# Patient Record
Sex: Male | Born: 1935 | Race: White | Hispanic: No | Marital: Single | Smoking: Former smoker
Health system: Southern US, Community
[De-identification: ages and names within clinical notes are randomized; demographics above are authoritative.]

## PROBLEM LIST (undated history)

## (undated) DIAGNOSIS — J302 Other seasonal allergic rhinitis: Secondary | ICD-10-CM

## (undated) DIAGNOSIS — N390 Urinary tract infection, site not specified: Secondary | ICD-10-CM

## (undated) DIAGNOSIS — I700 Atherosclerosis of aorta: Secondary | ICD-10-CM

## (undated) DIAGNOSIS — R319 Hematuria, unspecified: Secondary | ICD-10-CM

## (undated) DIAGNOSIS — K5730 Diverticulosis of large intestine without perforation or abscess without bleeding: Secondary | ICD-10-CM

## (undated) DIAGNOSIS — I4819 Other persistent atrial fibrillation: Secondary | ICD-10-CM

## (undated) DIAGNOSIS — I4892 Unspecified atrial flutter: Secondary | ICD-10-CM

## (undated) DIAGNOSIS — K4090 Unilateral inguinal hernia, without obstruction or gangrene, not specified as recurrent: Secondary | ICD-10-CM

## (undated) DIAGNOSIS — I272 Pulmonary hypertension, unspecified: Secondary | ICD-10-CM

## (undated) DIAGNOSIS — C4490 Unspecified malignant neoplasm of skin, unspecified: Secondary | ICD-10-CM

## (undated) DIAGNOSIS — I10 Essential (primary) hypertension: Secondary | ICD-10-CM

## (undated) DIAGNOSIS — Z8669 Personal history of other diseases of the nervous system and sense organs: Secondary | ICD-10-CM

## (undated) HISTORY — DX: Hematuria, unspecified: R31.9

## (undated) HISTORY — DX: Essential (primary) hypertension: I10

## (undated) HISTORY — PX: CATARACT EXTRACTION, BILATERAL: SHX1313

## (undated) HISTORY — DX: Other persistent atrial fibrillation: I48.19

## (undated) HISTORY — PX: TYMPANOPLASTY: SHX33

## (undated) HISTORY — PX: COLONOSCOPY: SHX174

---

## 2002-10-07 ENCOUNTER — Ambulatory Visit (HOSPITAL_COMMUNITY): Admission: RE | Admit: 2002-10-07 | Discharge: 2002-10-07 | Payer: Self-pay | Admitting: Gastroenterology

## 2017-07-03 ENCOUNTER — Other Ambulatory Visit (HOSPITAL_COMMUNITY): Payer: Self-pay | Admitting: *Deleted

## 2017-07-03 DIAGNOSIS — I480 Paroxysmal atrial fibrillation: Secondary | ICD-10-CM

## 2017-07-04 ENCOUNTER — Ambulatory Visit (HOSPITAL_COMMUNITY)
Admission: RE | Admit: 2017-07-04 | Discharge: 2017-07-04 | Disposition: A | Discharge status: Home / Self Care | Payer: Medicare Other | Source: Ambulatory Visit | Attending: Internal Medicine | Admitting: Internal Medicine

## 2017-07-04 ENCOUNTER — Encounter (HOSPITAL_COMMUNITY): Payer: Self-pay | Admitting: Nurse Practitioner

## 2017-07-04 ENCOUNTER — Other Ambulatory Visit: Payer: Self-pay

## 2017-07-04 ENCOUNTER — Ambulatory Visit (HOSPITAL_BASED_OUTPATIENT_CLINIC_OR_DEPARTMENT_OTHER)
Admission: RE | Admit: 2017-07-04 | Discharge: 2017-07-04 | Disposition: A | Discharge status: Home / Self Care | Payer: Medicare Other | Source: Ambulatory Visit | Attending: Nurse Practitioner | Admitting: Nurse Practitioner

## 2017-07-04 VITALS — BP 130/78 | HR 115 | Ht 67.0 in | Wt 196.2 lb

## 2017-07-04 DIAGNOSIS — I4891 Unspecified atrial fibrillation: Secondary | ICD-10-CM | POA: Diagnosis present

## 2017-07-04 DIAGNOSIS — I480 Paroxysmal atrial fibrillation: Secondary | ICD-10-CM | POA: Diagnosis not present

## 2017-07-04 DIAGNOSIS — I272 Pulmonary hypertension, unspecified: Secondary | ICD-10-CM

## 2017-07-04 DIAGNOSIS — Z87891 Personal history of nicotine dependence: Secondary | ICD-10-CM | POA: Insufficient documentation

## 2017-07-04 DIAGNOSIS — Z79899 Other long term (current) drug therapy: Secondary | ICD-10-CM | POA: Insufficient documentation

## 2017-07-04 HISTORY — DX: Pulmonary hypertension, unspecified: I27.20

## 2017-07-04 MED ORDER — METOPROLOL TARTRATE 25 MG PO TABS: 12.5000 mg | ORAL_TABLET | Freq: Two times a day (BID) | ORAL | 1 refills | Status: DC

## 2017-07-04 MED ORDER — APIXABAN 5 MG PO TABS: 5.0000 mg | ORAL_TABLET | Freq: Two times a day (BID) | ORAL | 0 refills | Status: DC

## 2017-07-04 NOTE — Progress Notes (Signed)
Echocardiogram 2D Echocardiogram has been performed.  Manuel Moreno 07/04/2017, 10:39 AM

## 2017-07-04 NOTE — Patient Instructions (Signed)
Start Eliquis 5mg  twice a day  Start Metoprolol 1/2 tablet twice a day

## 2017-07-05 ENCOUNTER — Telehealth (HOSPITAL_COMMUNITY): Payer: Self-pay | Admitting: *Deleted

## 2017-07-05 NOTE — Progress Notes (Signed)
Primary Care Physician: Dr. 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 male that is in the afib clinic for new onset afib that was seen yesterday at time of cataract surgery. He was completely asymptomatic. In the office now for further evaluation. EKG continues to show afib at 115 bpm. He still feels well, denies any shortness of breath with exertion or dyspnea.. Just completed Echo ordered by Dr. Virgina Jock. Results pending.  He denies high caffeine intake. Recommended not to exceed 2 drinks a week. Denies snoring/apnea. He was asked to hold anticoagulation until today as he had cataract surgery yesterday.  Today, he denies symptoms of palpitations, chest pain, shortness of breath, orthopnea, PND, lower extremity edema, dizziness, presyncope, syncope, or neurologic sequela. The patient is tolerating medications without difficulties and is otherwise without complaint today.     Current Outpatient Medications  Medication Sig Dispense Refill  . Cholecalciferol (VITAMIN D) 2000 units CAPS Take 1 capsule by mouth daily.    Marland Kitchen lisinopril (PRINIVIL,ZESTRIL) 20 MG tablet Take 20 mg by mouth daily.    Marland Kitchen loratadine (CLARITIN) 10 MG tablet Take 10 mg by mouth daily.    . Misc Natural Products (PROSTATE HEALTH PO) Take 1 tablet by mouth daily.    . Misc Natural Products (TART CHERRY ADVANCED PO) Take 1 tablet by mouth daily.    . Multiple Vitamin (MULTIVITAMIN) tablet Take 1 tablet by mouth daily.    . Multiple Vitamins-Minerals (PRESERVISION AREDS 2 PO) Take 1 tablet by mouth 2 (two) times daily.    Marland Kitchen apixaban (ELIQUIS) 5 MG TABS tablet Take 1 tablet (5 mg total) by mouth 2 (two) times daily. 60 tablet 0  . metoprolol tartrate (LOPRESSOR) 25 MG tablet Take 0.5 tablets (12.5 mg total) by mouth 2 (two) times daily. 60 tablet 1   No current facility-administered medications for this encounter.     No Known Allergies  Social History   Socioeconomic History  . Marital status:  Single    Spouse name: Not on file  . Number of children: Not on file  . Years of education: Not on file  . Highest education level: Not on file  Occupational History  . Not on file  Social Needs  . Financial resource strain: Not on file  . Food insecurity:    Worry: Not on file    Inability: Not on file  . Transportation needs:    Medical: Not on file    Non-medical: Not on file  Tobacco Use  . Smoking status: Former Research scientist (life sciences)  . Smokeless tobacco: Never Used  Substance and Sexual Activity  . Alcohol use: Not on file  . Drug use: Not on file  . Sexual activity: Not on file  Lifestyle  . Physical activity:    Days per week: Not on file    Minutes per session: Not on file  . Stress: Not on file  Relationships  . Social connections:    Talks on phone: Not on file    Gets together: Not on file    Attends religious service: Not on file    Active member of club or organization: Not on file    Attends meetings of clubs or organizations: Not on file    Relationship status: Not on file  . Intimate partner violence:    Fear of current or ex partner: Not on file    Emotionally abused: Not on file    Physically abused: Not on file  Forced sexual activity: Not on file  Other Topics Concern  . Not on file  Social History Narrative  . Not on file    No family history on file.  ROS- All systems are reviewed and negative except as per the HPI above  Physical Exam: Vitals:   07/04/17 1042  BP: 130/78  Pulse: (!) 115  Weight: 196 lb 3.2 oz (89 kg)  Height: 5\' 7"  (1.702 m)   Wt Readings from Last 3 Encounters:  07/04/17 196 lb 3.2 oz (89 kg)    Labs: No results found for: NA, K, CL, CO2, GLUCOSE, BUN, CREATININE, CALCIUM, PHOS, MG No results found for: INR No results found for: CHOL, HDL, LDLCALC, TRIG   GEN- The patient is well appearing, alert and oriented x 3 today.   Head- normocephalic, atraumatic Eyes-  Sclera clear, conjunctiva pink Ears- hearing  intact Oropharynx- clear Neck- supple, no JVP Lymph- no cervical lymphadenopathy Lungs- Clear to ausculation bilaterally, normal work of breathing Heart- Regular rate and rhythm, no murmurs, rubs or gallops, PMI not laterally displaced GI- soft, NT, ND, + BS Extremities- no clubbing, cyanosis, or edema MS- no significant deformity or atrophy Skin- no rash or lesion Psych- euthymic mood, full affect Neuro- strength and sensation are intact  EKG-Afib at 115 bpm, Qrs int 66 ms, qtc 434 ms Echo- Resulted at time of writing note- Study Conclusions  - Left ventricle: The cavity size was normal. Wall thickness was   normal. Indeterminant diastolic function (atrial fibrillation).   The estimated ejection fraction was 50%. Diffuse hypokinesis. - Mitral valve: Mildly calcified annulus. There was no significant   regurgitation. - Left atrium: The atrium was moderately dilated. - Right ventricle: The cavity size was normal. Systolic function   was normal. - Right atrium: The atrium was mildly dilated. - Tricuspid valve: Peak RV-RA gradient (S): 36 mm Hg. - Pulmonary arteries: PA peak pressure: 39 mm Hg (S). - Inferior vena cava: The vessel was normal in size. The   respirophasic diameter changes were in the normal range (>= 50%),   consistent with normal central venous pressure.  Impressions:  - The patient was in rapid atrial fibrillation. EF 50%, mild   diffuse hypokinesis. Normal RV size and systolic function. No   significant valvular abnormalities. Mild pulmonary hypertension.    Assessment and Plan: 1. New onset asymptomatic afib General eduction re afib Feels well Needs rate control Will start metoprolol tartrate 25 mg , 1/2 tab bid to start and may need up titration He will start Eliquis 5 mg bid (80 in July, Weight 196 lb and creatinine from PCP office on recent draw was 1.0) Denies a bleeding history Bleeding precautions discussed  Will see back in 7-10 days  Butch Penny  C. Lizmarie Witters, [facility] [address] [place], [place] [address] [phone]

## 2017-07-05 NOTE — Telephone Encounter (Signed)
Patient called in stating since starting eliquis last night he has noticed dark brown/reddish urine. States he does not have any burning or frequency no history of kidney stones. Instructed pt to contact PCP today for further workup. Pt verbalized understanding.

## 2017-07-11 ENCOUNTER — Ambulatory Visit (HOSPITAL_COMMUNITY)
Admission: RE | Admit: 2017-07-11 | Discharge: 2017-07-11 | Disposition: A | Discharge status: Home / Self Care | Payer: Medicare Other | Source: Ambulatory Visit | Attending: Nurse Practitioner | Admitting: Nurse Practitioner

## 2017-07-11 VITALS — BP 112/64 | HR 92 | Resp 20 | Ht 67.0 in | Wt 191.8 lb

## 2017-07-11 DIAGNOSIS — I4891 Unspecified atrial fibrillation: Secondary | ICD-10-CM | POA: Diagnosis not present

## 2017-07-11 DIAGNOSIS — I4439 Other atrioventricular block: Secondary | ICD-10-CM | POA: Insufficient documentation

## 2017-07-11 DIAGNOSIS — I272 Pulmonary hypertension, unspecified: Secondary | ICD-10-CM | POA: Diagnosis not present

## 2017-07-11 DIAGNOSIS — Z7901 Long term (current) use of anticoagulants: Secondary | ICD-10-CM | POA: Insufficient documentation

## 2017-07-11 DIAGNOSIS — I480 Paroxysmal atrial fibrillation: Secondary | ICD-10-CM | POA: Diagnosis not present

## 2017-07-11 DIAGNOSIS — Z79899 Other long term (current) drug therapy: Secondary | ICD-10-CM | POA: Insufficient documentation

## 2017-07-11 DIAGNOSIS — Z87891 Personal history of nicotine dependence: Secondary | ICD-10-CM | POA: Insufficient documentation

## 2017-07-11 DIAGNOSIS — I4892 Unspecified atrial flutter: Secondary | ICD-10-CM | POA: Diagnosis not present

## 2017-07-11 NOTE — Progress Notes (Signed)
Primary Care Physician: Dr. 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 male that is in the afib clinic for new onset afib that was seen yesterday at time of cataract surgery. He was completely asymptomatic. In the office now for further evaluation. EKG continues to show afib at 115 bpm. He still feels well, denies any shortness of breath with exertion or dyspnea.. Just completed Echo ordered by Dr. Virgina Jock. Results pending.  He denies high caffeine intake. Recommended not to exceed 2 drinks a week. Denies snoring/apnea. He was asked to hold anticoagulation until today as he had cataract surgery yesterday.   F/u in afib clinic, he remains in rate controlled afib, after adding BB. After one day on anticoagulation, he noted blood in urine. Went back to PCP and was found to have UTI and is on Cipro, finishes tomorrow. The blood resolved after 2 days on antibiotic's. He is pending the second cataract on Tuesday.  Today, he denies symptoms of palpitations, chest pain, shortness of breath, orthopnea, PND, lower extremity edema, dizziness, presyncope, syncope, or neurologic sequela. The patient is tolerating medications without difficulties and is otherwise without complaint today.     Current Outpatient Medications  Medication Sig Dispense Refill  . apixaban (ELIQUIS) 5 MG TABS tablet Take 1 tablet (5 mg total) by mouth 2 (two) times daily. 60 tablet 0  . Cholecalciferol (VITAMIN D) 2000 units CAPS Take 1 capsule by mouth daily.    Marland Kitchen lisinopril (PRINIVIL,ZESTRIL) 20 MG tablet Take 20 mg by mouth daily.    Marland Kitchen loratadine (CLARITIN) 10 MG tablet Take 10 mg by mouth daily.    . metoprolol tartrate (LOPRESSOR) 25 MG tablet Take 0.5 tablets (12.5 mg total) by mouth 2 (two) times daily. 60 tablet 1  . Multiple Vitamin (MULTIVITAMIN) tablet Take 1 tablet by mouth daily.    . Multiple Vitamins-Minerals (PRESERVISION AREDS 2 PO) Take 1 tablet by mouth 2 (two) times daily.    . Misc  Natural Products (PROSTATE HEALTH PO) Take 1 tablet by mouth daily.    . Misc Natural Products (TART CHERRY ADVANCED PO) Take 1 tablet by mouth daily.     No current facility-administered medications for this encounter.     No Known Allergies  Social History   Socioeconomic History  . Marital status: Single    Spouse name: Not on file  . Number of children: Not on file  . Years of education: Not on file  . Highest education level: Not on file  Occupational History  . Not on file  Social Needs  . Financial resource strain: Not on file  . Food insecurity:    Worry: Not on file    Inability: Not on file  . Transportation needs:    Medical: Not on file    Non-medical: Not on file  Tobacco Use  . Smoking status: Former Research scientist (life sciences)  . Smokeless tobacco: Never Used  Substance and Sexual Activity  . Alcohol use: Not on file  . Drug use: Not on file  . Sexual activity: Not on file  Lifestyle  . Physical activity:    Days per week: Not on file    Minutes per session: Not on file  . Stress: Not on file  Relationships  . Social connections:    Talks on phone: Not on file    Gets together: Not on file    Attends religious service: Not on file    Active member of club or  organization: Not on file    Attends meetings of clubs or organizations: Not on file    Relationship status: Not on file  . Intimate partner violence:    Fear of current or ex partner: Not on file    Emotionally abused: Not on file    Physically abused: Not on file    Forced sexual activity: Not on file  Other Topics Concern  . Not on file  Social History Narrative  . Not on file    No family history on file.  ROS- All systems are reviewed and negative except as per the HPI above  Physical Exam: Vitals:   07/11/17 1448  BP: 112/64  Pulse: 92  Resp: 20  Weight: 191 lb 12.8 oz (87 kg)  Height: 5\' 7"  (1.702 m)   Wt Readings from Last 3 Encounters:  07/11/17 191 lb 12.8 oz (87 kg)  07/04/17 196 lb 3.2  oz (89 kg)    Labs: No results found for: NA, K, CL, CO2, GLUCOSE, BUN, CREATININE, CALCIUM, PHOS, MG No results found for: INR No results found for: CHOL, HDL, LDLCALC, TRIG   GEN- The patient is well appearing, alert and oriented x 3 today.   Head- normocephalic, atraumatic Eyes-  Sclera clear, conjunctiva pink Ears- hearing intact Oropharynx- clear Neck- supple, no JVP Lymph- no cervical lymphadenopathy Lungs- Clear to ausculation bilaterally, normal work of breathing Heart- irregular rate and rhythm, no murmurs, rubs or gallops, PMI not laterally displaced GI- soft, NT, ND, + BS Extremities- no clubbing, cyanosis, or edema MS- no significant deformity or atrophy Skin- no rash or lesion Psych- euthymic mood, full affect Neuro- strength and sensation are intact  EKG-Afib at 115 bpm, Qrs int 66 ms, qtc 434 ms Echo- Resulted at time of writing note- Study Conclusions  - Left ventricle: The cavity size was normal. Wall thickness was   normal. Indeterminant diastolic function (atrial fibrillation).   The estimated ejection fraction was 50%. Diffuse hypokinesis. - Mitral valve: Mildly calcified annulus. There was no significant   regurgitation. - Left atrium: The atrium was moderately dilated. - Right ventricle: The cavity size was normal. Systolic function   was normal. - Right atrium: The atrium was mildly dilated. - Tricuspid valve: Peak RV-RA gradient (S): 36 mm Hg. - Pulmonary arteries: PA peak pressure: 39 mm Hg (S). - Inferior vena cava: The vessel was normal in size. The   respirophasic diameter changes were in the normal range (>= 50%),   consistent with normal central venous pressure.  Impressions:  - The patient was in rapid atrial fibrillation. EF 50%, mild   diffuse hypokinesis. Normal RV size and systolic function. No   significant valvular abnormalities. Mild pulmonary hypertension.    Assessment and Plan: 1. New onset asymptomatic afib General  eduction re afib Feels well, slightly light headed since adding BB Better rate controlled, especially needed for upcoming cataract surgery Continue metoprolol tartrate 25 mg , 1/2 tab bid   Continue Eliquis 5 mg bid (80 in July, Weight 196 lb and creatinine from PCP office on recent draw was 1.0) Will plan on cardioversion after being on anticoagulation x 3 weeks  Will see back in 7  days  Butch Penny C. Theda Payer, [facility] [address] [place], [place] [address] [phone]

## 2017-07-16 ENCOUNTER — Ambulatory Visit
Admission: RE | Admit: 2017-07-16 | Discharge: 2017-07-16 | Disposition: A | Discharge status: Home / Self Care | Payer: Medicare Other | Source: Ambulatory Visit | Attending: Internal Medicine | Admitting: Internal Medicine

## 2017-07-16 ENCOUNTER — Other Ambulatory Visit: Payer: Self-pay | Admitting: Internal Medicine

## 2017-07-16 DIAGNOSIS — R319 Hematuria, unspecified: Secondary | ICD-10-CM

## 2017-07-16 DIAGNOSIS — K5730 Diverticulosis of large intestine without perforation or abscess without bleeding: Secondary | ICD-10-CM

## 2017-07-16 DIAGNOSIS — I700 Atherosclerosis of aorta: Secondary | ICD-10-CM

## 2017-07-16 DIAGNOSIS — K4090 Unilateral inguinal hernia, without obstruction or gangrene, not specified as recurrent: Secondary | ICD-10-CM

## 2017-07-16 HISTORY — DX: Diverticulosis of large intestine without perforation or abscess without bleeding: K57.30

## 2017-07-16 HISTORY — DX: Atherosclerosis of aorta: I70.0

## 2017-07-16 HISTORY — DX: Unilateral inguinal hernia, without obstruction or gangrene, not specified as recurrent: K40.90

## 2017-07-19 ENCOUNTER — Encounter (HOSPITAL_COMMUNITY): Payer: Self-pay | Admitting: Nurse Practitioner

## 2017-07-19 ENCOUNTER — Ambulatory Visit (HOSPITAL_COMMUNITY)
Admission: RE | Admit: 2017-07-19 | Discharge: 2017-07-19 | Disposition: A | Discharge status: Home / Self Care | Payer: Medicare Other | Source: Ambulatory Visit | Attending: Nurse Practitioner | Admitting: Nurse Practitioner

## 2017-07-19 VITALS — BP 114/76 | HR 73 | Ht 67.0 in | Wt 191.0 lb

## 2017-07-19 DIAGNOSIS — R9431 Abnormal electrocardiogram [ECG] [EKG]: Secondary | ICD-10-CM | POA: Insufficient documentation

## 2017-07-19 DIAGNOSIS — Z7901 Long term (current) use of anticoagulants: Secondary | ICD-10-CM | POA: Insufficient documentation

## 2017-07-19 DIAGNOSIS — I481 Persistent atrial fibrillation: Secondary | ICD-10-CM | POA: Diagnosis not present

## 2017-07-19 DIAGNOSIS — Z87891 Personal history of nicotine dependence: Secondary | ICD-10-CM | POA: Diagnosis not present

## 2017-07-19 DIAGNOSIS — R319 Hematuria, unspecified: Secondary | ICD-10-CM | POA: Diagnosis not present

## 2017-07-19 DIAGNOSIS — I4819 Other persistent atrial fibrillation: Secondary | ICD-10-CM

## 2017-07-19 DIAGNOSIS — Z79899 Other long term (current) drug therapy: Secondary | ICD-10-CM | POA: Insufficient documentation

## 2017-07-19 DIAGNOSIS — I4892 Unspecified atrial flutter: Secondary | ICD-10-CM | POA: Diagnosis present

## 2017-07-19 NOTE — Progress Notes (Signed)
Primary Care Physician: Dr. 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 male that is in the afib clinic for new onset afib that was seen yesterday at time of cataract surgery. He was completely asymptomatic. In the office now for further evaluation. EKG continues to show afib at 115 bpm. He still feels well, denies any shortness of breath with exertion or dyspnea.. Just completed Echo ordered by Dr. Virgina Jock. Results pending.  He denies high caffeine intake. Recommended not to exceed 2 drinks a week. Denies snoring/apnea. He was asked to hold anticoagulation until today as he had cataract surgery yesterday.   F/u in afib clinic, he remains in rate controlled afib, after adding BB. After one day on anticoagulation, he noted blood in urine. Went back to PCP and was found to have UTI and is on Cipro, finishes tomorrow. The blood resolved after 2 days on antibiotic's. He is pending the second cataract on Tuesday.  F/u afib clinic, 6/6. He had second cataract  surgery and was told he was in and out of rhythm while on the monitor. I have no strips to review. Pt still would not be eligible for DCCV until after 6/12, but  This will not be helpful if he is paroxysmal. EKG shows atrial flutter with variable  block today. He is still having issues with hematuria. After finishing first antibiotic, hematuria stopped but was present again in a few days. He is now on Cipro and bleeding stopped again on drug after a few days. PCP told pt that if bleeding reoccurs after finishing Cipro, he will send to urology. He is asymptomatic with arrhythmia.  Today, he denies symptoms of palpitations, chest pain, shortness of breath, orthopnea, PND, lower extremity edema, dizziness, presyncope, syncope, or neurologic sequela. The patient is tolerating medications without difficulties and is otherwise without complaint today.     Current Outpatient Medications  Medication Sig Dispense Refill  . apixaban  (ELIQUIS) 5 MG TABS tablet Take 1 tablet (5 mg total) by mouth 2 (two) times daily. 60 tablet 0  . Cholecalciferol (VITAMIN D) 2000 units CAPS Take 1 capsule by mouth daily.    . ciprofloxacin (CIPRO) 250 MG tablet Take 250 mg by mouth 2 (two) times daily.  0  . lisinopril (PRINIVIL,ZESTRIL) 20 MG tablet Take 20 mg by mouth daily.    Marland Kitchen loratadine (CLARITIN) 10 MG tablet Take 10 mg by mouth daily.    . metoprolol tartrate (LOPRESSOR) 25 MG tablet Take 0.5 tablets (12.5 mg total) by mouth 2 (two) times daily. 60 tablet 1  . Misc Natural Products (PROSTATE HEALTH PO) Take 1 tablet by mouth daily.    . Misc Natural Products (TART CHERRY ADVANCED PO) Take 1 tablet by mouth daily.    . Multiple Vitamin (MULTIVITAMIN) tablet Take 1 tablet by mouth daily.    . Multiple Vitamins-Minerals (PRESERVISION AREDS 2 PO) Take 1 tablet by mouth 2 (two) times daily.     No current facility-administered medications for this encounter.     No Known Allergies  Social History   Socioeconomic History  . Marital status: Single    Spouse name: Not on file  . Number of children: Not on file  . Years of education: Not on file  . Highest education level: Not on file  Occupational History  . Not on file  Social Needs  . Financial resource strain: Not on file  . Food insecurity:    Worry: Not on file  Inability: Not on file  . Transportation needs:    Medical: Not on file    Non-medical: Not on file  Tobacco Use  . Smoking status: Former Research scientist (life sciences)  . Smokeless tobacco: Never Used  Substance and Sexual Activity  . Alcohol use: Not on file  . Drug use: Not on file  . Sexual activity: Not on file  Lifestyle  . Physical activity:    Days per week: Not on file    Minutes per session: Not on file  . Stress: Not on file  Relationships  . Social connections:    Talks on phone: Not on file    Gets together: Not on file    Attends religious service: Not on file    Active member of club or organization: Not  on file    Attends meetings of clubs or organizations: Not on file    Relationship status: Not on file  . Intimate partner violence:    Fear of current or ex partner: Not on file    Emotionally abused: Not on file    Physically abused: Not on file    Forced sexual activity: Not on file  Other Topics Concern  . Not on file  Social History Narrative  . Not on file    No family history on file.  ROS- All systems are reviewed and negative except as per the HPI above  Physical Exam: Vitals:   07/19/17 1136  BP: 114/76  Pulse: 73  Weight: 191 lb (86.6 kg)  Height: 5\' 7"  (1.702 m)   Wt Readings from Last 3 Encounters:  07/19/17 191 lb (86.6 kg)  07/11/17 191 lb 12.8 oz (87 kg)  07/04/17 196 lb 3.2 oz (89 kg)    Labs: No results found for: NA, K, CL, CO2, GLUCOSE, BUN, CREATININE, CALCIUM, PHOS, MG No results found for: INR No results found for: CHOL, HDL, LDLCALC, TRIG   GEN- The patient is well appearing, alert and oriented x 3 today.   Head- normocephalic, atraumatic Eyes-  Sclera clear, conjunctiva pink Ears- hearing intact Oropharynx- clear Neck- supple, no JVP Lymph- no cervical lymphadenopathy Lungs- Clear to ausculation bilaterally, normal work of breathing Heart- irregular rate and rhythm, no murmurs, rubs or gallops, PMI not laterally displaced GI- soft, NT, ND, + BS Extremities- no clubbing, cyanosis, or edema MS- no significant deformity or atrophy Skin- no rash or lesion Psych- euthymic mood, full affect Neuro- strength and sensation are intact  EKG-A flutter at 73bpm, Qrs int 72 ms, qtc 425 ms Echo- Study Conclusions  - Left ventricle: The cavity size was normal. Wall thickness was   normal. Indeterminant diastolic function (atrial fibrillation).   The estimated ejection fraction was 50%. Diffuse hypokinesis. - Mitral valve: Mildly calcified annulus. There was no significant   regurgitation. - Left atrium: The atrium was moderately dilated. -  Right ventricle: The cavity size was normal. Systolic function   was normal. - Right atrium: The atrium was mildly dilated. - Tricuspid valve: Peak RV-RA gradient (S): 36 mm Hg. - Pulmonary arteries: PA peak pressure: 39 mm Hg (S). - Inferior vena cava: The vessel was normal in size. The   respirophasic diameter changes were in the normal range (>= 50%),   consistent with normal central venous pressure.  Impressions:  - The patient was in rapid atrial fibrillation. EF 50%, mild   diffuse hypokinesis. Normal RV size and systolic function. No   significant valvular abnormalities. Mild pulmonary hypertension.    Assessment and  Plan: 1. New onset asymptomatic afib General eduction re afib Feels well  Better rate controlled with BB Continue metoprolol tartrate 25 mg , 1/2 tab bid   Continue Eliquis 5 mg bid (80 in July, Weight 196 lb and creatinine from PCP office on recent draw was 1.0) eligible for cardioversion after 6/12 but will place an event monitor on pt to see if paroxysmal since it was reported to pt that he was in SR off and on with cataract surgery Will plan on seeing pt after monitor results are in  2. Hematuria Per PCP  On second round of antibhiotics IF hematuria does not clear with this, plan per  pt is to go  to urology  F/u in clinic in 4-6 weeks  Butch Penny C. Donnica Jarnagin, [facility] [address] [place], [place] [address] [phone]

## 2017-07-19 NOTE — Patient Instructions (Signed)
[facility]  [phone] Heart monitor

## 2017-07-27 ENCOUNTER — Other Ambulatory Visit (HOSPITAL_COMMUNITY): Payer: Self-pay | Admitting: *Deleted

## 2017-07-27 ENCOUNTER — Ambulatory Visit (INDEPENDENT_AMBULATORY_CARE_PROVIDER_SITE_OTHER): Payer: Medicare Other

## 2017-07-27 ENCOUNTER — Telehealth (HOSPITAL_COMMUNITY): Payer: Self-pay | Admitting: *Deleted

## 2017-07-27 DIAGNOSIS — I4819 Other persistent atrial fibrillation: Secondary | ICD-10-CM

## 2017-07-27 DIAGNOSIS — I481 Persistent atrial fibrillation: Secondary | ICD-10-CM

## 2017-07-27 MED ORDER — APIXABAN 5 MG PO TABS: 5.0000 mg | ORAL_TABLET | Freq: Two times a day (BID) | ORAL | 6 refills | Status: DC

## 2017-07-27 NOTE — Telephone Encounter (Signed)
Received strips from [employer] ([employer]) from this am 9:24 am.  It is the baseline transmission with findings of atrial flutter, HR 80 Reviewed and signed by Dr. Irish Lack (DOD).  Strips to medical records to be scanned in chart.

## 2017-07-27 NOTE — Telephone Encounter (Signed)
Received call from [employer] - pt monitor was placed - first recording is aflutter controlled rate in the 70s. Pt is asymptomatic at this time. Strips are being faxed to Dr. Jackalyn Lombard office. Monitor was placed to see if patient is persistent of paroxysmal.Will continue to monitor patient. -

## 2017-08-29 ENCOUNTER — Encounter (HOSPITAL_COMMUNITY): Payer: Self-pay | Admitting: Nurse Practitioner

## 2017-08-29 ENCOUNTER — Ambulatory Visit (HOSPITAL_COMMUNITY)
Admission: RE | Admit: 2017-08-29 | Discharge: 2017-08-29 | Disposition: A | Discharge status: Home / Self Care | Payer: Medicare Other | Source: Ambulatory Visit | Attending: Nurse Practitioner | Admitting: Nurse Practitioner

## 2017-08-29 VITALS — BP 134/78 | HR 80 | Ht 67.0 in | Wt 191.6 lb

## 2017-08-29 DIAGNOSIS — Z79899 Other long term (current) drug therapy: Secondary | ICD-10-CM | POA: Insufficient documentation

## 2017-08-29 DIAGNOSIS — I4891 Unspecified atrial fibrillation: Secondary | ICD-10-CM | POA: Insufficient documentation

## 2017-08-29 DIAGNOSIS — I272 Pulmonary hypertension, unspecified: Secondary | ICD-10-CM | POA: Insufficient documentation

## 2017-08-29 DIAGNOSIS — I480 Paroxysmal atrial fibrillation: Secondary | ICD-10-CM

## 2017-08-29 DIAGNOSIS — R319 Hematuria, unspecified: Secondary | ICD-10-CM | POA: Diagnosis not present

## 2017-08-29 DIAGNOSIS — Z7901 Long term (current) use of anticoagulants: Secondary | ICD-10-CM | POA: Diagnosis not present

## 2017-08-29 DIAGNOSIS — Z87891 Personal history of nicotine dependence: Secondary | ICD-10-CM | POA: Diagnosis not present

## 2017-08-29 LAB — CBC
HCT: 38.7 % — ABNORMAL LOW (ref 39.0–52.0)
HEMOGLOBIN: 12.5 g/dL — AB (ref 13.0–17.0)
MCH: 31.6 pg (ref 26.0–34.0)
MCHC: 32.3 g/dL (ref 30.0–36.0)
MCV: 98 fL (ref 78.0–100.0)
PLATELETS: 252 10*3/uL (ref 150–400)
RBC: 3.95 MIL/uL — ABNORMAL LOW (ref 4.22–5.81)
RDW: 12.3 % (ref 11.5–15.5)
WBC: 6.8 10*3/uL (ref 4.0–10.5)

## 2017-08-29 MED ORDER — APIXABAN 5 MG PO TABS: 5.0000 mg | ORAL_TABLET | Freq: Two times a day (BID) | ORAL | 6 refills | Status: DC

## 2017-08-29 NOTE — Patient Instructions (Addendum)
Stop metoprolol   f/u with Dr. Rayann Heman within the month - scheduling will call you to set up.  CBC today.

## 2017-08-29 NOTE — Progress Notes (Addendum)
Primary Care Physician: Dr. 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 male that is in the afib clinic for new onset afib that was seen yesterday at time of cataract surgery. He was completely asymptomatic. In the office now for further evaluation. EKG continues to show afib at 115 bpm. He still feels well, denies any shortness of breath with exertion or dyspnea.Just completed Echo ordered by Dr. Virgina Jock. Results pending.  He denies high caffeine intake. Recommended not to exceed 2 drinks a week. Denies snoring/apnea. He was asked to hold anticoagulation until today as he had cataract surgery yesterday.   F/u in afib clinic, he remains in rate controlled afib, after adding BB. After one day on anticoagulation, he noted blood in urine. Went back to PCP and was found to have UTI and is on Cipro, finishes tomorrow. The blood resolved after 2 days on antibiotic's. He is pending the second cataract on Tuesday.  F/u afib clinic, 6/6. He had second cataract  surgery and was told he was in and out of rhythm while on the monitor. I have no strips to review. Pt still would not be eligible for DCCV until after 6/12, but  This will not be helpful if he is paroxysmal. EKG shows atrial flutter with variable  block today. He is still having issues with hematuria. After finishing first antibiotic, hematuria stopped but was present again in a few days. He is now on Cipro and bleeding stopped again on drug after a few days. PCP told pt that if bleeding reoccurs after finishing Cipro, he will send to urology. He is asymptomatic with arrhythmia.  Fu/ in afib clinic,7/17. Ekg shows controlled atrial flutter. He reports that he feels miserable . He states that he cannot sleep as to having to urinate all night. He thinks this is secondary to one of the meds for afib. He feels lightheaded and fatigued. He is still having hematuria and is pending a cystoscopy on Monday.  30 day monitor raw data is  reviewed, has not been read by MD as of yet. This does show that he is in SR for over 80% of the time, and afib/flutter less than 20% of the time. He has been very well rate controlled with most HR's around 80 bpm.  Today, he denies symptoms of palpitations, chest pain, shortness of breath, orthopnea, PND, lower extremity edema, dizziness, presyncope, syncope, or neurologic sequela.+ for lightheadedness/increased urination, hematuria, fatigue. The patient is tolerating medications without difficulties and is otherwise without complaint today.     Current Outpatient Medications  Medication Sig Dispense Refill  . apixaban (ELIQUIS) 5 MG TABS tablet Take 1 tablet (5 mg total) by mouth 2 (two) times daily. 60 tablet 6  . Cholecalciferol (VITAMIN D) 2000 units CAPS Take 1 capsule by mouth daily.    Marland Kitchen lisinopril (PRINIVIL,ZESTRIL) 20 MG tablet Take 20 mg by mouth daily.    Marland Kitchen loratadine (CLARITIN) 10 MG tablet Take 10 mg by mouth daily.    . Misc Natural Products (PROSTATE HEALTH PO) Take 1 tablet by mouth daily.    . Misc Natural Products (TART CHERRY ADVANCED PO) Take 1 tablet by mouth daily.    . Multiple Vitamin (MULTIVITAMIN) tablet Take 1 tablet by mouth daily.    . Multiple Vitamins-Minerals (PRESERVISION AREDS 2 PO) Take 1 tablet by mouth 2 (two) times daily.     No current facility-administered medications for this encounter.     No Known Allergies  Social History   Socioeconomic History  . Marital status: Single    Spouse name: Not on file  . Number of children: Not on file  . Years of education: Not on file  . Highest education level: Not on file  Occupational History  . Not on file  Social Needs  . Financial resource strain: Not on file  . Food insecurity:    Worry: Not on file    Inability: Not on file  . Transportation needs:    Medical: Not on file    Non-medical: Not on file  Tobacco Use  . Smoking status: Former Research scientist (life sciences)  . Smokeless tobacco: Never Used  Substance  and Sexual Activity  . Alcohol use: Not on file  . Drug use: Not on file  . Sexual activity: Not on file  Lifestyle  . Physical activity:    Days per week: Not on file    Minutes per session: Not on file  . Stress: Not on file  Relationships  . Social connections:    Talks on phone: Not on file    Gets together: Not on file    Attends religious service: Not on file    Active member of club or organization: Not on file    Attends meetings of clubs or organizations: Not on file    Relationship status: Not on file  . Intimate partner violence:    Fear of current or ex partner: Not on file    Emotionally abused: Not on file    Physically abused: Not on file    Forced sexual activity: Not on file  Other Topics Concern  . Not on file  Social History Narrative  . Not on file    No family history on file.  ROS- All systems are reviewed and negative except as per the HPI above  Physical Exam: Vitals:   08/29/17 1138  BP: 134/78  Pulse: 80  Weight: 191 lb 9.6 oz (86.9 kg)  Height: 5\' 7"  (1.702 m)   Wt Readings from Last 3 Encounters:  08/29/17 191 lb 9.6 oz (86.9 kg)  07/19/17 191 lb (86.6 kg)  07/11/17 191 lb 12.8 oz (87 kg)    Labs: No results found for: NA, K, CL, CO2, GLUCOSE, BUN, CREATININE, CALCIUM, PHOS, MG No results found for: INR No results found for: CHOL, HDL, LDLCALC, TRIG   GEN- The patient is well appearing, alert and oriented x 3 today.   Head- normocephalic, atraumatic Eyes-  Sclera clear, conjunctiva pink Ears- hearing intact Oropharynx- clear Neck- supple, no JVP Lymph- no cervical lymphadenopathy Lungs- Clear to ausculation bilaterally, normal work of breathing Heart- irregular rate and rhythm, no murmurs, rubs or gallops, PMI not laterally displaced GI- soft, NT, ND, + BS Extremities- no clubbing, cyanosis, or edema MS- no significant deformity or atrophy Skin- no rash or lesion Psych- euthymic mood, full affect Neuro- strength and  sensation are intact  EKG-A fib/ flutter at 80 bpm, Qrs int 72 ms, qtc 425 ms Echo- Study Conclusions  - Left ventricle: The cavity size was normal. Wall thickness was   normal. Indeterminant diastolic function (atrial fibrillation).   The estimated ejection fraction was 50%. Diffuse hypokinesis. - Mitral valve: Mildly calcified annulus. There was no significant   regurgitation. - Left atrium: The atrium was moderately dilated. - Right ventricle: The cavity size was normal. Systolic function   was normal. - Right atrium: The atrium was mildly dilated. - Tricuspid valve: Peak RV-RA gradient (S): 36 mm  Hg. - Pulmonary arteries: PA peak pressure: 39 mm Hg (S). - Inferior vena cava: The vessel was normal in size. The   respirophasic diameter changes were in the normal range (>= 50%),   consistent with normal central venous pressure.  Impressions:  - The patient was in rapid atrial fibrillation. EF 50%, mild   diffuse hypokinesis. Normal RV size and systolic function. No   significant valvular abnormalities. Mild pulmonary hypertension.  30 day monitor reviewed shows 80+ % of time in SR, less than 20 % of time in afib, rate ocntrolled  Assessment and Plan: 1. New onset asymptomatic afib/flutter Feels poorly,contributes to either eliquis or metoprolol  Monitor reveals mostly SR Will stop metoprolol and see if pt maintains rate control with paroxysmal episodes and if he feels improved Continue Eliquis 5 mg bid (age 83, Weight 196 lb and creatinine from PCP office on recent draw was 1.0) for now CBC today for persistent hematuria to check for presence of anemia, do not have prior CBC and will request from PCP office  Not a candidate for cardioversion as monitor proves that pt is paroxysmal   2. Hematuria Present since start of anticoagulation Pending cystoscopy on Monday    Addendum- 7/23- Review of 30 day monitor after interpretation by Dr. Lovena Le showed that  afib or flutter  was present  the whole time pt wore the monitor, where the raw data read the aflutter as SR. Therefore, pt would benefit from cardioversion and he would like to precede with this. He states that he has been feeling poorly and blaming BB and eliquis but I believe it is the arrhythmia. He did see urology for hematuria and was told his prostate was enlarged and was given meds. He has not missed any of anticoagulation x 3 weeks so will bring back to office and proceed with restart BB and cardioversion.  He will f/u one week  with Dr. Rayann Heman as planned 8/19.  Geroge Baseman Annelisa Ryback, [facility] [address] [place], [place] [address] [phone]

## 2017-09-04 NOTE — Addendum Note (Signed)
Encounter addended by: Sherran Needs, NP on: 09/04/2017 9:52 AM  Actions taken: Sign clinical note

## 2017-09-06 ENCOUNTER — Ambulatory Visit (HOSPITAL_COMMUNITY)
Admission: RE | Admit: 2017-09-06 | Discharge: 2017-09-06 | Disposition: A | Discharge status: Home / Self Care | Payer: Medicare Other | Source: Ambulatory Visit | Attending: Nurse Practitioner | Admitting: Nurse Practitioner

## 2017-09-06 ENCOUNTER — Encounter (HOSPITAL_COMMUNITY): Payer: Self-pay | Admitting: Nurse Practitioner

## 2017-09-06 VITALS — BP 108/66 | HR 90 | Ht 67.0 in | Wt 196.0 lb

## 2017-09-06 DIAGNOSIS — I481 Persistent atrial fibrillation: Secondary | ICD-10-CM | POA: Diagnosis not present

## 2017-09-06 DIAGNOSIS — I4819 Other persistent atrial fibrillation: Secondary | ICD-10-CM

## 2017-09-06 DIAGNOSIS — Z7901 Long term (current) use of anticoagulants: Secondary | ICD-10-CM | POA: Diagnosis not present

## 2017-09-06 DIAGNOSIS — I4892 Unspecified atrial flutter: Secondary | ICD-10-CM | POA: Diagnosis present

## 2017-09-06 DIAGNOSIS — I4891 Unspecified atrial fibrillation: Secondary | ICD-10-CM | POA: Diagnosis present

## 2017-09-06 DIAGNOSIS — R319 Hematuria, unspecified: Secondary | ICD-10-CM | POA: Diagnosis not present

## 2017-09-06 DIAGNOSIS — Z87891 Personal history of nicotine dependence: Secondary | ICD-10-CM | POA: Insufficient documentation

## 2017-09-06 DIAGNOSIS — Z79899 Other long term (current) drug therapy: Secondary | ICD-10-CM | POA: Insufficient documentation

## 2017-09-06 LAB — BASIC METABOLIC PANEL
Anion gap: 9 (ref 5–15)
BUN: 18 mg/dL (ref 8–23)
CHLORIDE: 110 mmol/L (ref 98–111)
CO2: 23 mmol/L (ref 22–32)
CREATININE: 0.99 mg/dL (ref 0.61–1.24)
Calcium: 8.8 mg/dL — ABNORMAL LOW (ref 8.9–10.3)
GFR calc Af Amer: >60 mL/min (ref >60)
GFR calc non Af Amer: >60 mL/min (ref >60)
GLUCOSE: 142 mg/dL — AB (ref 70–99)
POTASSIUM: 4.1 mmol/L (ref 3.5–5.1)
Sodium: 142 mmol/L (ref 135–145)

## 2017-09-06 LAB — CBC
HCT: 34.1 % — ABNORMAL LOW (ref 39.0–52.0)
HEMOGLOBIN: 11.1 g/dL — AB (ref 13.0–17.0)
MCH: 31.7 pg (ref 26.0–34.0)
MCHC: 32.6 g/dL (ref 30.0–36.0)
MCV: 97.4 fL (ref 78.0–100.0)
PLATELETS: 205 10*3/uL (ref 150–400)
RBC: 3.5 MIL/uL — ABNORMAL LOW (ref 4.22–5.81)
RDW: 12.8 % (ref 11.5–15.5)
WBC: 5.3 10*3/uL (ref 4.0–10.5)

## 2017-09-06 NOTE — Patient Instructions (Signed)
Cardioversion scheduled for Tuesday, July 30th  - Arrive at the [facility] and go to admitting at [facility] not eat or drink anything after midnight the night prior to your procedure.  - Take all your morning medication with a sip of water prior to arrival.  - You will not be able to drive home after your procedure.

## 2017-09-06 NOTE — Progress Notes (Signed)
Primary Care Physician: Dr. 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 male that is in the afib clinic for new onset afib that was seen yesterday at time of cataract surgery. He was completely asymptomatic. In the office now for further evaluation. EKG continues to show afib at 115 bpm. He still feels well, denies any shortness of breath with exertion or dyspnea.Just completed Echo ordered by Dr. Virgina Jock. Results pending.  He denies high caffeine intake. Recommended not to exceed 2 drinks a week. Denies snoring/apnea. He was asked to hold anticoagulation until today as he had cataract surgery yesterday.   F/u in afib clinic, he remains in rate controlled afib, after adding BB. After one day on anticoagulation, he noted blood in urine. Went back to PCP and was found to have UTI and is on Cipro, finishes tomorrow. The blood resolved after 2 days on antibiotic's. He is pending the second cataract on Tuesday.  F/u afib clinic, 6/6. He had second cataract  surgery and was told he was in and out of rhythm while on the monitor. I have no strips to review. Pt still would not be eligible for DCCV until after 6/12, but  This will not be helpful if he is paroxysmal. EKG shows atrial flutter with variable  block today. He is still having issues with hematuria. After finishing first antibiotic, hematuria stopped but was present again in a few days. He is now on Cipro and bleeding stopped again on drug after a few days. PCP told pt that if bleeding reoccurs after finishing Cipro, he will send to urology. He is asymptomatic with arrhythmia.  Fu/ in afib clinic,7/17. Ekg shows controlled atrial flutter. He reports that he feels miserable . He states that he cannot sleep as to having to urinate all night. He thinks this is secondary to one of the meds for afib. He feels lightheaded and fatigued. He is still having hematuria and is pending a cystoscopy on Monday.  30 day monitor raw data is  reviewed, has not been read by MD as of yet. This does show that he is in SR for over 80% of the time, and afib/flutter less than 20% of the time. He has been very well rate controlled with most HR's around 80 bpm.  F/u in afib clinic, 7/25,after  monitor was read by cardiology and showed that pt was  persistently in afib/flutter( report was reading aflutter as SR) and he is back to be set up for cardioversion. On last visit, I stopped metoprolol as he felt the new meds(BB, DOAC) were making him feel terrible. He reported that he was not sleeping 2/2 to frequent urination and was tired. He was still having hematuria. 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 The bleeding has stopped. He is agreeable for cardioversion and has not missed any doses of eliquis 5 mg bid.  Today, he denies symptoms of palpitations, chest pain, shortness of breath, orthopnea, PND, lower extremity edema, dizziness, presyncope, syncope, or neurologic sequela.+ for  fatigue. The patient is tolerating medications without difficulties and is otherwise without complaint today.     Current Outpatient Medications  Medication Sig Dispense Refill  . alfuzosin (UROXATRAL) 10 MG 24 hr tablet Take 10 mg by mouth daily with breakfast.    . apixaban (ELIQUIS) 5 MG  TABS tablet Take 1 tablet (5 mg total) by mouth 2 (two) times daily. 60 tablet 6  . Cholecalciferol (VITAMIN D) 2000 units CAPS Take 1 capsule by mouth daily.    . finasteride (PROSCAR) 5 MG tablet Take 5 mg by mouth daily.    Marland Kitchen lisinopril (PRINIVIL,ZESTRIL) 20 MG tablet Take 20 mg by mouth daily.    Marland Kitchen loratadine (CLARITIN) 10 MG tablet Take 10 mg by mouth daily.    . Misc Natural Products (PROSTATE HEALTH PO) Take 1 tablet by mouth daily.    . Misc Natural Products (TART CHERRY ADVANCED PO) Take 1 tablet by mouth daily.    . Multiple Vitamin (MULTIVITAMIN)  tablet Take 1 tablet by mouth daily.    . Multiple Vitamins-Minerals (PRESERVISION AREDS 2 PO) Take 1 tablet by mouth 2 (two) times daily.     No current facility-administered medications for this encounter.     No Known Allergies  Social History   Socioeconomic History  . Marital status: Single    Spouse name: Not on file  . Number of children: Not on file  . Years of education: Not on file  . Highest education level: Not on file  Occupational History  . Not on file  Social Needs  . Financial resource strain: Not on file  . Food insecurity:    Worry: Not on file    Inability: Not on file  . Transportation needs:    Medical: Not on file    Non-medical: Not on file  Tobacco Use  . Smoking status: Former Research scientist (life sciences)  . Smokeless tobacco: Never Used  Substance and Sexual Activity  . Alcohol use: Not on file  . Drug use: Not on file  . Sexual activity: Not on file  Lifestyle  . Physical activity:    Days per week: Not on file    Minutes per session: Not on file  . Stress: Not on file  Relationships  . Social connections:    Talks on phone: Not on file    Gets together: Not on file    Attends religious service: Not on file    Active member of club or organization: Not on file    Attends meetings of clubs or organizations: Not on file    Relationship status: Not on file  . Intimate partner violence:    Fear of current or ex partner: Not on file    Emotionally abused: Not on file    Physically abused: Not on file    Forced sexual activity: Not on file  Other Topics Concern  . Not on file  Social History Narrative  . Not on file    No family history on file.  ROS- All systems are reviewed and negative except as per the HPI above  Physical Exam: Vitals:   09/06/17 0957  BP: 108/66  Pulse: 90  Weight: 196 lb (88.9 kg)  Height: 5\' 7"  (1.702 m)   Wt Readings from Last 3 Encounters:  09/06/17 196 lb (88.9 kg)  08/29/17 191 lb 9.6 oz (86.9 kg)  07/19/17 191 lb  (86.6 kg)    Labs: No results found for: NA, K, CL, CO2, GLUCOSE, BUN, CREATININE, CALCIUM, PHOS, MG No results found for: INR No results found for: CHOL, HDL, LDLCALC, TRIG   GEN- The patient is well appearing, alert and oriented x 3 today.   Head- normocephalic, atraumatic Eyes-  Sclera clear, conjunctiva pink Ears- hearing intact Oropharynx- clear Neck- supple, no JVP Lymph- no cervical  lymphadenopathy Lungs- Clear to ausculation bilaterally, normal work of breathing Heart- irregular rate and rhythm, no murmurs, rubs or gallops, PMI not laterally displaced GI- soft, NT, ND, + BS Extremities- no clubbing, cyanosis, or edema MS- no significant deformity or atrophy Skin- no rash or lesion Psych- euthymic mood, full affect Neuro- strength and sensation are intact  EKG-A flutter at 90 bpm, qrs int 70 ms, qtc 430 ms Echo- Study Conclusions  - Left ventricle: The cavity size was normal. Wall thickness was   normal. Indeterminant diastolic function (atrial fibrillation).   The estimated ejection fraction was 50%. Diffuse hypokinesis. - Mitral valve: Mildly calcified annulus. There was no significant   regurgitation. - Left atrium: The atrium was moderately dilated. - Right ventricle: The cavity size was normal. Systolic function   was normal. - Right atrium: The atrium was mildly dilated. - Tricuspid valve: Peak RV-RA gradient (S): 36 mm Hg. - Pulmonary arteries: PA peak pressure: 39 mm Hg (S). - Inferior vena cava: The vessel was normal in size. The   respirophasic diameter changes were in the normal range (>= 50%),   consistent with normal central venous pressure.  Impressions:  - The patient was in rapid atrial fibrillation. EF 50%, mild   diffuse hypokinesis. Normal RV size and systolic function. No   significant valvular abnormalities. Mild pulmonary hypertension.  30 day monitor -Study Highlights   1. Atrial fib or atrial fib/flutter with a CVR and RVR 2. No  prolonged pauses 3. Noise artifact 4. No ventricular arrhythmias noted      Assessment and Plan: 1. New onset asymptomatic afib/flutter Felt poorly,contributed to either eliquis or metoprolol,but now improved with tx of enlarged protate Off metoprolol at pt's request for fatigue, is rate controlled today Continue Eliquis 5 mg bid (age 22, Weight 196 lb and creatinine from PCP office on recent draw was 1.0), currently hematuria has stopped with treatment for enlarged prostate, states no missed doses since starting  Cardioversion discussed risk vrs benefit, and pt would like to proceed   2. Hematuria Present since start of anticoagulation Now resolved since seeing urology and starting treatment for enlarged prostate  He will f/u  with Dr. Rayann Heman as planned 8/19.  Geroge Baseman Tivon Lemoine, [facility] [address] [place], [place] [address] [phone]

## 2017-09-10 ENCOUNTER — Ambulatory Visit (HOSPITAL_COMMUNITY): Payer: Medicare Other | Admitting: Nurse Practitioner

## 2017-09-11 ENCOUNTER — Encounter (HOSPITAL_COMMUNITY): Payer: Self-pay | Admitting: *Deleted

## 2017-09-11 ENCOUNTER — Ambulatory Visit (HOSPITAL_COMMUNITY): Payer: Medicare Other | Admitting: Anesthesiology

## 2017-09-11 ENCOUNTER — Ambulatory Visit (HOSPITAL_COMMUNITY)
Admission: RE | Admit: 2017-09-11 | Discharge: 2017-09-11 | Disposition: A | Discharge status: Home / Self Care | Payer: Medicare Other | Source: Ambulatory Visit | Attending: Internal Medicine | Admitting: Internal Medicine

## 2017-09-11 ENCOUNTER — Encounter (HOSPITAL_COMMUNITY)
Admission: RE | Disposition: A | Discharge status: Home / Self Care | Payer: Self-pay | Source: Ambulatory Visit | Attending: Internal Medicine

## 2017-09-11 ENCOUNTER — Other Ambulatory Visit: Payer: Self-pay

## 2017-09-11 DIAGNOSIS — Z87891 Personal history of nicotine dependence: Secondary | ICD-10-CM | POA: Insufficient documentation

## 2017-09-11 DIAGNOSIS — I10 Essential (primary) hypertension: Secondary | ICD-10-CM | POA: Diagnosis not present

## 2017-09-11 DIAGNOSIS — I483 Typical atrial flutter: Secondary | ICD-10-CM

## 2017-09-11 DIAGNOSIS — E669 Obesity, unspecified: Secondary | ICD-10-CM | POA: Diagnosis not present

## 2017-09-11 DIAGNOSIS — Z6830 Body mass index (BMI) 30.0-30.9, adult: Secondary | ICD-10-CM | POA: Insufficient documentation

## 2017-09-11 DIAGNOSIS — Z7901 Long term (current) use of anticoagulants: Secondary | ICD-10-CM | POA: Insufficient documentation

## 2017-09-11 DIAGNOSIS — I4892 Unspecified atrial flutter: Secondary | ICD-10-CM | POA: Diagnosis present

## 2017-09-11 DIAGNOSIS — D649 Anemia, unspecified: Secondary | ICD-10-CM | POA: Diagnosis not present

## 2017-09-11 DIAGNOSIS — I4891 Unspecified atrial fibrillation: Secondary | ICD-10-CM | POA: Insufficient documentation

## 2017-09-11 HISTORY — PX: CARDIOVERSION: SHX1299

## 2017-09-11 SURGERY — CARDIOVERSION
Anesthesia: General

## 2017-09-11 MED ORDER — PROPOFOL 10 MG/ML IV BOLUS
INTRAVENOUS | Status: DC | PRN
  Administered 2017-09-11: 50 mg via INTRAVENOUS

## 2017-09-11 MED ORDER — LIDOCAINE HCL (CARDIAC) PF 100 MG/5ML IV SOSY
PREFILLED_SYRINGE | INTRAVENOUS | Status: DC | PRN
  Administered 2017-09-11: 60 mg via INTRAVENOUS

## 2017-09-11 NOTE — Anesthesia Preprocedure Evaluation (Addendum)
Anesthesia Evaluation  Patient identified by MRN, date of birth, ID band Patient awake    Reviewed: Allergy & Precautions, NPO status , Patient's Chart, lab work & pertinent test results  Airway Mallampati: II  TM Distance: >3 FB Neck ROM: Full    Dental no notable dental hx.    Pulmonary neg pulmonary ROS, former smoker,    Pulmonary exam normal breath sounds clear to auscultation       Cardiovascular hypertension, Pt. on medications Normal cardiovascular exam+ dysrhythmias Atrial Fibrillation  Rhythm:Regular Rate:Normal  ECG: A-flutter, rate 90   Neuro/Psych negative neurological ROS  negative psych ROS   GI/Hepatic negative GI ROS, Neg liver ROS,   Endo/Other  negative endocrine ROS  Renal/GU negative Renal ROS     Musculoskeletal negative musculoskeletal ROS (+)   Abdominal (+) + obese,   Peds  Hematology  (+) anemia ,   Anesthesia Other Findings A-FIB  Reproductive/Obstetrics                           Anesthesia Physical Anesthesia Plan  ASA: III  Anesthesia Plan: General   Post-op Pain Management:    Induction: Intravenous  PONV Risk Score and Plan: 2 and Propofol infusion and Treatment may vary due to age or medical condition  Airway Management Planned: Mask  Additional Equipment:   Intra-op Plan:   Post-operative Plan:   Informed Consent: I have reviewed the patients History and Physical, chart, labs and discussed the procedure including the risks, benefits and alternatives for the proposed anesthesia with the patient or authorized representative who has indicated his/her understanding and acceptance.   Dental advisory given  Plan Discussed with: CRNA  Anesthesia Plan Comments:         Anesthesia Quick Evaluation

## 2017-09-11 NOTE — Transfer of Care (Signed)
Immediate Anesthesia Transfer of Care Note  Patient: [place] Moreno  Procedure(s) Performed: CARDIOVERSION (N/A )  Patient Location: Endoscopy Unit and   Anesthesia Type:General  Level of Consciousness: awake, alert  and oriented  Airway & Oxygen Therapy: Patient Spontanous Breathing and Patient connected to nasal cannula oxygen  Post-op Assessment: Report given to RN and Post -op Vital signs reviewed and stable  Post vital signs: Reviewed and stable  Last Vitals:  Vitals Value Taken Time  BP 120/77 09/11/2017 12:35 PM  Temp    Pulse 55 09/11/2017 12:37 PM  Resp 15 09/11/2017 12:37 PM  SpO2 100 % 09/11/2017 12:37 PM    Last Pain:  Vitals:   09/11/17 1137  TempSrc: Oral  PainSc: 0-No pain         Complications: No apparent anesthesia complications

## 2017-09-11 NOTE — Anesthesia Procedure Notes (Signed)
Procedure Name: General with mask airway Date/Time: 09/11/2017 12:17 PM Performed by: Eligha Bridegroom, CRNA Pre-anesthesia Checklist: Patient identified, Emergency Drugs available, Suction available, Patient being monitored and Timeout performed Patient Re-evaluated:Patient Re-evaluated prior to induction Oxygen Delivery Method: Ambu bag Preoxygenation: Pre-oxygenation with 100% oxygen Induction Type: IV induction

## 2017-09-11 NOTE — H&P (Signed)
   INTERVAL PROCEDURE H&P  History and Physical Interval Note:  09/11/2017 10:32 AM  Manuel Moreno has presented today for their planned procedure. The various methods of treatment have been discussed with the patient and family. After consideration of risks, benefits and other options for treatment, the patient has consented to the procedure.  The patients' outpatient history has been reviewed, patient examined, and no change in status from most recent office note within the past 30 days. I have reviewed the patients' chart and labs and will proceed as planned. Questions were answered to the patient's satisfaction.   Manuel Casino, MD, [facility], [place] Director of the Advanced Lipid Disorders &  Cardiovascular Risk Reduction Clinic Diplomate of the American Board of Clinical Lipidology Attending Cardiologist  Direct Dial: [phone]  Fax: [phone]  Website:  [name] 09/11/2017, 10:32 AM

## 2017-09-11 NOTE — CV Procedure (Signed)
   CARDIOVERSION NOTE  Procedure: Electrical Cardioversion Indications:  Atrial Flutter  Procedure Details:  Consent: Risks of procedure as well as the alternatives and risks of each were explained to the (patient/caregiver).  Consent for procedure obtained.  Time Out: Verified patient identification, verified procedure, site/side was marked, verified correct patient position, special equipment/implants available, medications/allergies/relevent history reviewed, required imaging and test results available.  Performed  Patient placed on cardiac monitor, pulse oximetry, supplemental oxygen as necessary.  Sedation given: Propofol per anesthesia Pacer pads placed anterior and posterior chest.  Cardioverted 1 time(s).  Cardioverted at 150J biphasic.  Impression: Findings: Post procedure EKG shows: NSR Complications: None Patient did tolerate procedure well.  Plan: 1. Successful DCCV to NSR with a single 150J biphasic shock. 2. Follow-up with Roderic Palau, NP in the afib clinic.  Time Spent Directly with the Patient:  30 minutes   Pixie Casino, MD, [facility], [place] Director of the Advanced Lipid Disorders &  Cardiovascular Risk Reduction Clinic Diplomate of the American Board of Clinical Lipidology Attending Cardiologist  Direct Dial: [phone]  Fax: [phone]  Website:  [email or web address] 09/11/2017, 12:41 PM

## 2017-09-12 NOTE — Anesthesia Postprocedure Evaluation (Signed)
Anesthesia Post Note  Patient: Manuel Moreno  Procedure(s) Performed: CARDIOVERSION (N/A )     Patient location during evaluation: PACU Anesthesia Type: General Level of consciousness: awake and alert Pain management: pain level controlled Vital Signs Assessment: post-procedure vital signs reviewed and stable Respiratory status: spontaneous breathing, nonlabored ventilation, respiratory function stable and patient connected to nasal cannula oxygen Cardiovascular status: blood pressure returned to baseline and stable Postop Assessment: no apparent nausea or vomiting Anesthetic complications: no    Last Vitals:  Vitals:   09/11/17 1240 09/11/17 1253  BP: (!) 112/47 121/64  Pulse: 61 (!) 57  Resp: 19 15  Temp: 36.6 C   SpO2: 100% 99%    Last Pain:  Vitals:   09/11/17 1253  TempSrc:   PainSc: 0-No pain                 Ryan P Ellender

## 2017-10-01 ENCOUNTER — Ambulatory Visit (INDEPENDENT_AMBULATORY_CARE_PROVIDER_SITE_OTHER): Payer: Medicare Other | Admitting: Internal Medicine

## 2017-10-01 ENCOUNTER — Encounter: Payer: Self-pay | Admitting: Internal Medicine

## 2017-10-01 VITALS — BP 100/64 | HR 81 | Ht 67.0 in | Wt 193.8 lb

## 2017-10-01 DIAGNOSIS — I4819 Other persistent atrial fibrillation: Secondary | ICD-10-CM

## 2017-10-01 DIAGNOSIS — I481 Persistent atrial fibrillation: Secondary | ICD-10-CM | POA: Diagnosis not present

## 2017-10-01 MED ORDER — LISINOPRIL 10 MG PO TABS: 10.0000 mg | ORAL_TABLET | Freq: Every day | ORAL | 3 refills | Status: DC

## 2017-10-01 NOTE — Patient Instructions (Addendum)
Medication Instructions:  Your physician has recommended you make the following change in your medication:  1.  Decrease your lisinopril 20 mg- Take 1/2 tablet daily by mouth until gone.  When your 20 mg tabs are gone- your next prescription will be for lisinopril 10 mg tablets then take one tablet daily.  Labwork: None ordered.  Testing/Procedures: None ordered.  Follow-Up: Your physician wants you to follow-up in: 3 months with Roderic Palau, NP in afib clinic   Any Other Special Instructions Will Be Listed Below (If Applicable).  If you need a refill on your cardiac medications before your next appointment, please call your pharmacy.

## 2017-10-01 NOTE — Progress Notes (Signed)
Electrophysiology Office Note   Date:  10/01/2017   ID:  Manuel Moreno, DOB 1935/09/06, MRN [id number]  PCP:  Shon Baton, MD    Primary Electrophysiologist: Thompson Grayer, MD    CC: afib   History of Present Illness: Manuel Moreno is a 82 y.o. male who presents today for electrophysiology evaluation.   He is referred by Roderic Palau NP in the AF clinic for EP consultation regarding afib.  The patient was recently found to have afib at time of cataract  Surgery.  He was referred to AF clinic.  The patient has been followed in the AF clinic for several months and found to have both afib and atrial flutter. He has been placed on eliquis.  He underwent cardioversion 09/11/17 which was successful.  Unfortunately, he has returned to atrial flutter.  The patient has tried metoprolol but has not previously tried AAD therapy. He is asymptomatic with afib.  Denies feeling any better with recent cardioversion.  Energy is preserved.  He continues to swim laps without limitation. Today, he denies symptoms of palpitations, chest pain, shortness of breath, orthopnea, PND, lower extremity edema, claudication, presyncope, syncope, bleeding, or neurologic sequela. The patient is tolerating medications without difficulties and is otherwise without complaint today.  + occasional postural dizziness.   Past Medical History:  Diagnosis Date  . Hematuria   . Hypertension   . Persistent atrial fibrillation [facility])     Past Surgical History:  Procedure Laterality Date  . CARDIOVERSION N/A 09/11/2017   Procedure: CARDIOVERSION;  Surgeon: Pixie Casino, MD;  Location: [facility] ENDOSCOPY;  Service: Cardiovascular;  Laterality: N/A;  . EYE SURGERY Bilateral 06/2017, 07/2017     Current Outpatient Medications  Medication Sig Dispense Refill  . alfuzosin (UROXATRAL) 10 MG 24 hr tablet Take 10 mg by mouth daily with breakfast.    . apixaban (ELIQUIS) 5 MG TABS tablet Take 1 tablet (5 mg total) by mouth 2 (two)  times daily. 60 tablet 6  . Cholecalciferol (VITAMIN D) 2000 units CAPS Take 2,000 Units by mouth daily.     . finasteride (PROSCAR) 5 MG tablet Take 5 mg by mouth daily.    Marland Kitchen lisinopril (PRINIVIL,ZESTRIL) 20 MG tablet Take 20 mg by mouth daily.    Marland Kitchen loratadine (CLARITIN) 10 MG tablet Take 10 mg by mouth daily.    . Misc Natural Products (PROSTATE HEALTH PO) Take 1 tablet by mouth daily.    . Misc Natural Products (TART CHERRY ADVANCED PO) Take 1 Can by mouth daily.     . Multiple Vitamin (MULTIVITAMIN) tablet Take 1 tablet by mouth daily.    . Multiple Vitamins-Minerals (PRESERVISION AREDS 2 PO) Take 1 capsule by mouth 2 (two) times daily.      No current facility-administered medications for this visit.     Allergies:   Patient has no known allergies.   Social History:  The patient  reports that he has quit smoking. He has never used smokeless tobacco. He reports that he drinks alcohol. He reports that he does not use drugs.   Family History:  + HTN   ROS:  Please see the history of present illness.   All other systems are personally reviewed and negative.    PHYSICAL EXAM: VS:  BP 100/64   Pulse 81   Ht 5\' 7"  (1.702 m)   Wt 193 lb 12.8 oz (87.9 kg)   SpO2 98%   BMI 30.35 kg/m  , BMI Body mass index is  30.35 kg/m. GEN: Well nourished, well developed, in no acute distress  HEENT: normal  Neck: no JVD, carotid bruits, or masses Cardiac: iRRR; no murmurs, rubs, or gallops,no edema  Respiratory:  clear to auscultation bilaterally, normal work of breathing GI: soft, nontender, nondistended, + BS MS: no deformity or atrophy  Skin: warm and dry  Neuro:  Strength and sensation are intact Psych: euthymic mood, full affect  EKG:  EKG is ordered today. The ekg ordered today is personally reviewed and shows atypical atrial flutter vs coarse afib, V rate 81 bpm   Recent Labs: 09/06/2017: BUN 18; Creatinine, Ser 0.99; Hemoglobin 11.1; Platelets 205; Potassium 4.1; Sodium 142    personally reviewed   Lipid Panel  No results found for: CHOL, TRIG, HDL, CHOLHDL, VLDL, LDLCALC, LDLDIRECT personally reviewed   Wt Readings from Last 3 Encounters:  10/01/17 193 lb 12.8 oz (87.9 kg)  09/11/17 195 lb (88.5 kg)  09/06/17 196 lb (88.9 kg)      Other studies personally reviewed: Additional studies/ records that were reviewed today include: AF clinic notes (as above)  Review of the above records today demonstrates: echo 07/04/17 reveals EF 50%, moderate LA enlargement   ASSESSMENT AND PLAN:  1.  Persistent afib/ atypical atrial flutter (though possibly isthmus dependant) The patient has symptomatic, recurrent persistent asymptomatic atrial fibrillation. He has not tried AAD therapy Chads2vasc score is 3.  he is anticoagulated with eliquis . Therapeutic strategies for afib including rate control and rhythm control were discussed in detail with the patient today. At this time, he is clear that he would prefer rate control long term.  I think that this is very reasonable. No changes today Could consider tikosyn vs amiodarone if he becomes symptomatic in the future  2. htn Reports dizziness occasionally bp is low today Reduce lisinopril to 10mg  daily Regular self bp checks at home encouraged  Follow-up:  Return to see Butch Penny in the AF clinic every 3 months I will see as needed going forward  Current medicines are reviewed at length with the patient today.   The patient does not have concerns regarding his medicines.  The following changes were made today:  none    Signed, Thompson Grayer, MD  10/01/2017 3:28 PM     [address] [place] [address] [phone] (office) [phone] (fax)

## 2017-10-16 ENCOUNTER — Telehealth (HOSPITAL_COMMUNITY): Payer: Self-pay | Admitting: *Deleted

## 2017-10-16 NOTE — Telephone Encounter (Signed)
Patient called in very frustrated with having dark urine/blood in urine again. It had gotten better after seeing the urologist with addition of alfuzosin and proscar but the darker urine started coming back few weeks back. Pt convinced the Eliquis is causing all of his urinary problems and wants to stop the medication or reduce the dose. Eliquis is appropriately dosed and ChadVASC score of 3 were discussed with patient but pt is adamant he is overdosing on eliquis. Pt also endorsing he stopped his lisinopril b/c he was feeling much better on 1/2 dose per allred appt and doesn't feel he needs it. Instructed pt he should call his urologist in regards to blood in urine - states he has a call into his urologist already but physician is in the OR today - instructed him to see PCP to ensure no infection and await recommendations of urologist. Pt states he will see what urologist has to say but he is not going to stay on Eliquis if this continues to happen. Pt states he understands the risks but since we are not treating his AF (opted for rate control instead of rhythm control) he didn't feel it was necessary to continue eliquis. Attempted to explain to patient that treating AF or not he still was at risk for stroke but patient would talk over me. Will await urologist recommendations and patient will "go from there."

## 2018-01-01 ENCOUNTER — Encounter (HOSPITAL_COMMUNITY): Payer: Self-pay | Admitting: Nurse Practitioner

## 2018-01-01 ENCOUNTER — Ambulatory Visit (HOSPITAL_COMMUNITY)
Admission: RE | Admit: 2018-01-01 | Discharge: 2018-01-01 | Disposition: A | Discharge status: Home / Self Care | Payer: Medicare Other | Source: Ambulatory Visit | Attending: Nurse Practitioner | Admitting: Nurse Practitioner

## 2018-01-01 VITALS — BP 136/86 | HR 76 | Ht 67.0 in | Wt 196.0 lb

## 2018-01-01 DIAGNOSIS — R319 Hematuria, unspecified: Secondary | ICD-10-CM | POA: Insufficient documentation

## 2018-01-01 DIAGNOSIS — I480 Paroxysmal atrial fibrillation: Secondary | ICD-10-CM

## 2018-01-01 DIAGNOSIS — I4819 Other persistent atrial fibrillation: Secondary | ICD-10-CM | POA: Diagnosis not present

## 2018-01-01 DIAGNOSIS — Z87891 Personal history of nicotine dependence: Secondary | ICD-10-CM | POA: Insufficient documentation

## 2018-01-01 DIAGNOSIS — I4892 Unspecified atrial flutter: Secondary | ICD-10-CM | POA: Insufficient documentation

## 2018-01-01 DIAGNOSIS — Z7901 Long term (current) use of anticoagulants: Secondary | ICD-10-CM | POA: Insufficient documentation

## 2018-01-01 DIAGNOSIS — Z79899 Other long term (current) drug therapy: Secondary | ICD-10-CM | POA: Insufficient documentation

## 2018-01-01 DIAGNOSIS — I484 Atypical atrial flutter: Secondary | ICD-10-CM

## 2018-01-01 HISTORY — DX: Unspecified atrial flutter: I48.92

## 2018-01-01 NOTE — Progress Notes (Signed)
Primary Care Physician: Dr. 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 male that is in the afib clinic for new onset afib that was seen yesterday at time of cataract surgery. He was completely asymptomatic. In the office now for further evaluation. EKG continues to show afib at 115 bpm. He still feels well, denies any shortness of breath with exertion or dyspnea.Just completed Echo ordered by Dr. Virgina Jock. Results pending.  He denies high caffeine intake. Recommended not to exceed 2 drinks a week. Denies snoring/apnea. He was asked to hold anticoagulation until today as he had cataract surgery yesterday.   F/u in afib clinic, he remains in rate controlled afib, after adding BB. After one day on anticoagulation, he noted blood in urine. Went back to PCP and was found to have UTI and is on Cipro, finishes tomorrow. The blood resolved after 2 days on antibiotic's. He is pending the second cataract on Tuesday.  F/u afib clinic, 6/6. He had second cataract  surgery and was told he was in and out of rhythm while on the monitor. I have no strips to review. Pt still would not be eligible for DCCV until after 6/12, but  This will not be helpful if he is paroxysmal. EKG shows atrial flutter with variable  block today. He is still having issues with hematuria. After finishing first antibiotic, hematuria stopped but was present again in a few days. He is now on Cipro and bleeding stopped again on drug after a few days. PCP told pt that if bleeding reoccurs after finishing Cipro, he will send to urology. He is asymptomatic with arrhythmia.  Fu/ in afib clinic,7/17. Ekg shows controlled atrial flutter. He reports that he feels miserable . He states that he cannot sleep as to having to urinate all night. He thinks this is secondary to one of the meds for afib. He feels lightheaded and fatigued. He is still having hematuria and is pending a cystoscopy on Monday.  30 day monitor raw data is  reviewed, has not been read by MD as of yet. This does show that he is in SR for over 80% of the time, and afib/flutter less than 20% of the time. He has been very well rate controlled with most HR's around 80 bpm.  F/u in afib clinic, 7/25,after  monitor was read by cardiology and showed that pt was  persistently in afib/flutter( report was reading aflutter as SR) and he is back to be set up for cardioversion. On last visit, I stopped metoprolol as he felt the new meds(BB, DOAC) were making him feel terrible. He reported that he was not sleeping 2/2 to frequent urination and was tired. He was still having hematuria. 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 The bleeding has stopped. He is agreeable for cardioversion and has not missed any doses of eliquis 5 mg bid.  F/u in afib clinic, 11/19. He remains in asymptomatic afib. He feels well. He states on his own accord, cut his eliquis in half to 2.5 mg bid and the hematuria stopped. He did this 3 months ago. He has not re challenged the full dose.  She saw Dr. Rayann Heman back in August and it was decided that rate control was the way to proceed.  Today, he denies symptoms of palpitations, chest pain,  shortness of breath, orthopnea, PND, lower extremity edema, dizziness, presyncope, syncope, or neurologic sequela.. The patient is tolerating medications without difficulties and is otherwise without complaint today.     Current Outpatient Medications  Medication Sig Dispense Refill  . alfuzosin (UROXATRAL) 10 MG 24 hr tablet Take 10 mg by mouth daily with breakfast.    . apixaban (ELIQUIS) 5 MG TABS tablet Take 1 tablet (5 mg total) by mouth 2 (two) times daily. (Patient taking differently: Take 2.5 mg by mouth 2 (two) times daily. ) 60 tablet 6  . Cholecalciferol (VITAMIN D) 2000 units CAPS Take 2,000 Units by mouth daily.     .  finasteride (PROSCAR) 5 MG tablet Take 5 mg by mouth daily.    Marland Kitchen loratadine (CLARITIN) 10 MG tablet Take 10 mg by mouth daily.    . Misc Natural Products (PROSTATE HEALTH PO) Take 1 tablet by mouth daily.    . Multiple Vitamin (MULTIVITAMIN) tablet Take 1 tablet by mouth daily.    . Multiple Vitamins-Minerals (PRESERVISION AREDS 2 PO) Take 1 capsule by mouth 2 (two) times daily.      No current facility-administered medications for this encounter.     No Known Allergies  Social History   Socioeconomic History  . Marital status: Single    Spouse name: Not on file  . Number of children: Not on file  . Years of education: Not on file  . Highest education level: Not on file  Occupational History  . Not on file  Social Needs  . Financial resource strain: Not on file  . Food insecurity:    Worry: Not on file    Inability: Not on file  . Transportation needs:    Medical: Not on file    Non-medical: Not on file  Tobacco Use  . Smoking status: Former Research scientist (life sciences)  . Smokeless tobacco: Never Used  Substance and Sexual Activity  . Alcohol use: Yes    Comment: very occasional  . Drug use: Never  . Sexual activity: Not Currently  Lifestyle  . Physical activity:    Days per week: Not on file    Minutes per session: Not on file  . Stress: Not on file  Relationships  . Social connections:    Talks on phone: Not on file    Gets together: Not on file    Attends religious service: Not on file    Active member of club or organization: Not on file    Attends meetings of clubs or organizations: Not on file    Relationship status: Not on file  . Intimate partner violence:    Fear of current or ex partner: Not on file    Emotionally abused: Not on file    Physically abused: Not on file    Forced sexual activity: Not on file  Other Topics Concern  . Not on file  Social History Narrative  . Not on file    No family history on file.  ROS- All systems are reviewed and negative except as  per the HPI above  Physical Exam: Vitals:   01/01/18 1343  BP: 136/86  Pulse: 76  Weight: 88.9 kg  Height: 5\' 7"  (1.702 m)   Wt Readings from Last 3 Encounters:  01/01/18 88.9 kg  10/01/17 87.9 kg  09/11/17 88.5 kg    Labs: Lab Results  Component Value Date   NA 142 09/06/2017   K 4.1 09/06/2017   CL 110 09/06/2017   CO2 23 09/06/2017  GLUCOSE 142 (H) 09/06/2017   BUN 18 09/06/2017   CREATININE 0.99 09/06/2017   CALCIUM 8.8 (L) 09/06/2017   No results found for: INR No results found for: CHOL, HDL, LDLCALC, TRIG   GEN- The patient is well appearing, alert and oriented x 3 today.   Head- normocephalic, atraumatic Eyes-  Sclera clear, conjunctiva pink Ears- hearing intact Oropharynx- clear Neck- supple, no JVP Lymph- no cervical lymphadenopathy Lungs- Clear to ausculation bilaterally, normal work of breathing Heart- irregular rate and rhythm, no murmurs, rubs or gallops, PMI not laterally displaced GI- soft, NT, ND, + BS Extremities- no clubbing, cyanosis, or edema MS- no significant deformity or atrophy Skin- no rash or lesion Psych- euthymic mood, full affect Neuro- strength and sensation are intact  EKG-A flutter at 76 bpm (variable AV block) qrs int 70 ms, qtc 453 ms Echo- Study Conclusions  - Left ventricle: The cavity size was normal. Wall thickness was   normal. Indeterminant diastolic function (atrial fibrillation).   The estimated ejection fraction was 50%. Diffuse hypokinesis. - Mitral valve: Mildly calcified annulus. There was no significant   regurgitation. - Left atrium: The atrium was moderately dilated. - Right ventricle: The cavity size was normal. Systolic function   was normal. - Right atrium: The atrium was mildly dilated. - Tricuspid valve: Peak RV-RA gradient (S): 36 mm Hg. - Pulmonary arteries: PA peak pressure: 39 mm Hg (S). - Inferior vena cava: The vessel was normal in size. The   respirophasic diameter changes were in the normal  range (>= 50%),   consistent with normal central venous pressure.  Impressions:  - The patient was in rapid atrial fibrillation. EF 50%, mild   diffuse hypokinesis. Normal RV size and systolic function. No   significant valvular abnormalities. Mild pulmonary hypertension.  30 day monitor -Study Highlights   1. Atrial fib or atrial fib/flutter with a CVR and RVR 2. No prolonged pauses 3. Noise artifact 4. No ventricular arrhythmias noted      Assessment and Plan: 1. Asymptomatic persistent afib/flutter Continues to  be rate controlled without rate control drugs on board   Off metoprolol at pt's request for fatigue  He has been taking 2.5 mg of Eliquis( on his on accord) for previous hematuria. SInce his prostate has been treated for several  months now, I would like him to rechallenge  at full dose of 5 mg bid,(age 89, Weight 196 lb and creatinine of 0.99 ) . He is aware that taking the lower dose will put at higher risk of stroke.   2. Hematuria Intermittent since staring DOAC was found to have enlarged prostrate and started po meds to improve symptoms  Is due to see urologist again soon He plans to restart the 5 mg bid eliquis and discuss with him if bleeding resumes  He will f/u here in 4 months  Butch Penny C. Carroll, [facility] [address] [place], [place] [address] [phone]

## 2018-01-07 ENCOUNTER — Telehealth (HOSPITAL_COMMUNITY): Payer: Self-pay | Admitting: *Deleted

## 2018-01-07 MED ORDER — APIXABAN 5 MG PO TABS: 5.0000 mg | ORAL_TABLET | Freq: Two times a day (BID) | ORAL | 6 refills | Status: DC

## 2018-01-07 NOTE — Telephone Encounter (Signed)
Pt talked to his urologist who recommended he resume his eliquis at 5mg  BID. Pt states he will do this and see how his urine responds.

## 2018-02-04 ENCOUNTER — Telehealth: Payer: Self-pay | Admitting: Internal Medicine

## 2018-02-04 NOTE — Telephone Encounter (Signed)
   Primary Cardiologist: Pixie Casino, MD  Electrophysiology: Dr. Rayann Heman  Chart reviewed as part of pre-operative protocol coverage. Patient was contacted 02/04/2018 in reference to pre-operative risk assessment for pending surgery as outlined below.  Manuel Moreno was last seen on 01/01/18 by Roderic Palau, NP.  Since that day, SHISHIR KRANTZ has done well. He exercises 3-4 times per week at the [facility] and has had no symtpoms. He has asymptomatic chronic afib, no known CAD.   Therefore, based on ACC/AHA guidelines, the patient would be at acceptable risk for the planned procedure without further cardiovascular testing.   According to our pharmacy protocol: Patient with diagnosis of atrial fibrillation on Eliquis for anticoagulation.    Procedure: cytoscopy bladder biopsy Date of procedure: TBD  CHADS2-VASc score of  3 (, HTN, AGE,, AGE, ) CrCl 73.3 Platelet count 179  Per office protocol, patient can hold Eliquis for 3 days prior to procedure.    I will route this recommendation to the requesting party via Epic fax function and remove from pre-op pool.  Please call with questions.  Daune Perch, NP 02/04/2018, 4:34 PM

## 2018-02-04 NOTE — Telephone Encounter (Signed)
New Message.            [facility] Pre-operative Risk Assessment    Request for surgical clearance:  1. What type of surgery is being performed? Cystoscopy bladder biopsy   2. When is this surgery scheduled? Pending clearance  3. What type of clearance is required (medical clearance vs. Pharmacy clearance to hold med vs. Both)? Both  4. Are there any medications that need to be held prior to surgery and how long? Eliquis 2 to 3 days prior to surgery  5. Practice name and name of physician performing surgery? [facility]/ Dr. Jerilynn Mages [place]  6. What is your office phone number [phone]   7.   What is your office fax number [phone]  8.   Anesthesia type (None, local, MAC, general) ? General   Manuel Moreno 02/04/2018, 3:10 PM  _________________________________________________________________   (provider comments below)

## 2018-02-04 NOTE — Telephone Encounter (Signed)
Patient with diagnosis of atrial fibrillation on Eliquis for anticoagulation.    Procedure: cytoscopy bladder biopsy Date of procedure: TBD  CHADS2-VASc score of  3 (, HTN, AGE,, AGE, )  CrCl 73.3 Platelet count 179  Per office protocol, patient can hold Eliquis for 3 days prior to procedure.

## 2018-02-18 ENCOUNTER — Other Ambulatory Visit: Payer: Self-pay | Admitting: Urology

## 2018-03-07 ENCOUNTER — Encounter (HOSPITAL_BASED_OUTPATIENT_CLINIC_OR_DEPARTMENT_OTHER): Payer: Self-pay

## 2018-03-07 ENCOUNTER — Other Ambulatory Visit: Payer: Self-pay

## 2018-03-07 NOTE — Progress Notes (Signed)
Spoke with: Manuel Moreno NPO:  After Midnight, no gum, candy, or mints   Arrival time: 1749SW Labs: istat 4 (EKG11/19/2019, cardiac clearance12/23/2019 chart and epic) AM medications: Alfuzosin, Finasteride Pre op orders: Yes Ride home:  Leotis Pain (friend) [phone]

## 2018-03-15 ENCOUNTER — Ambulatory Visit (HOSPITAL_BASED_OUTPATIENT_CLINIC_OR_DEPARTMENT_OTHER): Payer: Medicare Other | Admitting: Anesthesiology

## 2018-03-15 ENCOUNTER — Emergency Department (HOSPITAL_BASED_OUTPATIENT_CLINIC_OR_DEPARTMENT_OTHER)
Admission: EM | Admit: 2018-03-15 | Discharge: 2018-03-15 | Disposition: A | Discharge status: Home / Self Care | Payer: Medicare Other | Source: Home / Self Care | Attending: Emergency Medicine | Admitting: Emergency Medicine

## 2018-03-15 ENCOUNTER — Ambulatory Visit (HOSPITAL_BASED_OUTPATIENT_CLINIC_OR_DEPARTMENT_OTHER)
Admission: RE | Admit: 2018-03-15 | Discharge: 2018-03-15 | Disposition: A | Discharge status: Home / Self Care | Payer: Medicare Other | Attending: Urology | Admitting: Urology

## 2018-03-15 ENCOUNTER — Encounter (HOSPITAL_BASED_OUTPATIENT_CLINIC_OR_DEPARTMENT_OTHER): Payer: Self-pay

## 2018-03-15 ENCOUNTER — Encounter (HOSPITAL_BASED_OUTPATIENT_CLINIC_OR_DEPARTMENT_OTHER)
Admission: RE | Disposition: A | Discharge status: Home / Self Care | Payer: Self-pay | Source: Home / Self Care | Attending: Urology

## 2018-03-15 ENCOUNTER — Other Ambulatory Visit: Payer: Self-pay

## 2018-03-15 DIAGNOSIS — I781 Nevus, non-neoplastic: Secondary | ICD-10-CM | POA: Insufficient documentation

## 2018-03-15 DIAGNOSIS — I700 Atherosclerosis of aorta: Secondary | ICD-10-CM | POA: Insufficient documentation

## 2018-03-15 DIAGNOSIS — I10 Essential (primary) hypertension: Secondary | ICD-10-CM | POA: Insufficient documentation

## 2018-03-15 DIAGNOSIS — Z85828 Personal history of other malignant neoplasm of skin: Secondary | ICD-10-CM

## 2018-03-15 DIAGNOSIS — K5730 Diverticulosis of large intestine without perforation or abscess without bleeding: Secondary | ICD-10-CM | POA: Diagnosis not present

## 2018-03-15 DIAGNOSIS — R310 Gross hematuria: Secondary | ICD-10-CM | POA: Diagnosis not present

## 2018-03-15 DIAGNOSIS — N323 Diverticulum of bladder: Secondary | ICD-10-CM | POA: Diagnosis not present

## 2018-03-15 DIAGNOSIS — I272 Pulmonary hypertension, unspecified: Secondary | ICD-10-CM | POA: Diagnosis not present

## 2018-03-15 DIAGNOSIS — Z7689 Persons encountering health services in other specified circumstances: Secondary | ICD-10-CM

## 2018-03-15 DIAGNOSIS — Z466 Encounter for fitting and adjustment of urinary device: Secondary | ICD-10-CM

## 2018-03-15 DIAGNOSIS — Z6830 Body mass index (BMI) 30.0-30.9, adult: Secondary | ICD-10-CM | POA: Diagnosis not present

## 2018-03-15 DIAGNOSIS — Z87891 Personal history of nicotine dependence: Secondary | ICD-10-CM

## 2018-03-15 DIAGNOSIS — N400 Enlarged prostate without lower urinary tract symptoms: Secondary | ICD-10-CM | POA: Diagnosis not present

## 2018-03-15 DIAGNOSIS — Z79899 Other long term (current) drug therapy: Secondary | ICD-10-CM | POA: Insufficient documentation

## 2018-03-15 DIAGNOSIS — E669 Obesity, unspecified: Secondary | ICD-10-CM | POA: Diagnosis not present

## 2018-03-15 DIAGNOSIS — Z7901 Long term (current) use of anticoagulants: Secondary | ICD-10-CM

## 2018-03-15 DIAGNOSIS — Z9842 Cataract extraction status, left eye: Secondary | ICD-10-CM | POA: Diagnosis not present

## 2018-03-15 DIAGNOSIS — Z9841 Cataract extraction status, right eye: Secondary | ICD-10-CM | POA: Diagnosis not present

## 2018-03-15 DIAGNOSIS — I4892 Unspecified atrial flutter: Secondary | ICD-10-CM | POA: Diagnosis not present

## 2018-03-15 HISTORY — DX: Unspecified malignant neoplasm of skin, unspecified: C44.90

## 2018-03-15 HISTORY — PX: CYSTOSCOPY WITH BIOPSY: SHX5122

## 2018-03-15 HISTORY — DX: Urinary tract infection, site not specified: N39.0

## 2018-03-15 HISTORY — DX: Pulmonary hypertension, unspecified: I27.20

## 2018-03-15 HISTORY — DX: Unilateral inguinal hernia, without obstruction or gangrene, not specified as recurrent: K40.90

## 2018-03-15 HISTORY — DX: Personal history of other diseases of the nervous system and sense organs: Z86.69

## 2018-03-15 HISTORY — DX: Unspecified atrial flutter: I48.92

## 2018-03-15 HISTORY — DX: Atherosclerosis of aorta: I70.0

## 2018-03-15 HISTORY — DX: Other seasonal allergic rhinitis: J30.2

## 2018-03-15 HISTORY — DX: Diverticulosis of large intestine without perforation or abscess without bleeding: K57.30

## 2018-03-15 LAB — POCT I-STAT 4, (NA,K, GLUC, HGB,HCT)
Glucose, Bld: 120 mg/dL — ABNORMAL HIGH (ref 70–99)
HCT: 38 % — ABNORMAL LOW (ref 39.0–52.0)
Hemoglobin: 12.9 g/dL — ABNORMAL LOW (ref 13.0–17.0)
Potassium: 4.1 mmol/L (ref 3.5–5.1)
SODIUM: 143 mmol/L (ref 135–145)

## 2018-03-15 SURGERY — CYSTOSCOPY, WITH BIOPSY
Anesthesia: General | Site: Bladder

## 2018-03-15 MED ORDER — OXYCODONE HCL 5 MG PO TABS
5.0000 mg | ORAL_TABLET | Freq: Once | ORAL | Status: DC | PRN
  Filled 2018-03-15: qty 1

## 2018-03-15 MED ORDER — LIDOCAINE 2% (20 MG/ML) 5 ML SYRINGE
INTRAMUSCULAR | Status: AC
  Filled 2018-03-15: qty 5

## 2018-03-15 MED ORDER — ONDANSETRON HCL 4 MG/2ML IJ SOLN
INTRAMUSCULAR | Status: AC
  Filled 2018-03-15: qty 2

## 2018-03-15 MED ORDER — STERILE WATER FOR IRRIGATION IR SOLN
Status: DC | PRN
  Administered 2018-03-15 (×2): 3000 mL

## 2018-03-15 MED ORDER — PROPOFOL 10 MG/ML IV BOLUS
INTRAVENOUS | Status: DC | PRN
  Administered 2018-03-15: 120 mg via INTRAVENOUS

## 2018-03-15 MED ORDER — FENTANYL CITRATE (PF) 100 MCG/2ML IJ SOLN
25.0000 ug | INTRAMUSCULAR | Status: DC | PRN
  Filled 2018-03-15: qty 1

## 2018-03-15 MED ORDER — LACTATED RINGERS IV SOLN
INTRAVENOUS | Status: DC
  Administered 2018-03-15 (×2): via INTRAVENOUS
  Filled 2018-03-15: qty 1000

## 2018-03-15 MED ORDER — APIXABAN 5 MG PO TABS: 5.0000 mg | ORAL_TABLET | Freq: Two times a day (BID) | ORAL | 6 refills | Status: DC

## 2018-03-15 MED ORDER — LIDOCAINE HCL (CARDIAC) PF 100 MG/5ML IV SOSY
PREFILLED_SYRINGE | INTRAVENOUS | Status: DC | PRN
  Administered 2018-03-15: 60 mg via INTRAVENOUS

## 2018-03-15 MED ORDER — CEFAZOLIN SODIUM-DEXTROSE 2-4 GM/100ML-% IV SOLN
2.0000 g | Freq: Once | INTRAVENOUS | Status: AC
  Administered 2018-03-15: 2 g via INTRAVENOUS
  Filled 2018-03-15: qty 100

## 2018-03-15 MED ORDER — PROPOFOL 10 MG/ML IV BOLUS
INTRAVENOUS | Status: AC
  Filled 2018-03-15: qty 20

## 2018-03-15 MED ORDER — FENTANYL CITRATE (PF) 100 MCG/2ML IJ SOLN
INTRAMUSCULAR | Status: DC | PRN
  Administered 2018-03-15: 100 ug via INTRAVENOUS

## 2018-03-15 MED ORDER — CEFAZOLIN SODIUM-DEXTROSE 2-4 GM/100ML-% IV SOLN
INTRAVENOUS | Status: AC
  Filled 2018-03-15: qty 100

## 2018-03-15 MED ORDER — OXYCODONE HCL 5 MG/5ML PO SOLN
5.0000 mg | Freq: Once | ORAL | Status: DC | PRN
  Filled 2018-03-15: qty 5

## 2018-03-15 MED ORDER — ONDANSETRON HCL 4 MG/2ML IJ SOLN
4.0000 mg | Freq: Once | INTRAMUSCULAR | Status: DC | PRN
  Filled 2018-03-15: qty 2

## 2018-03-15 MED ORDER — FENTANYL CITRATE (PF) 100 MCG/2ML IJ SOLN
INTRAMUSCULAR | Status: AC
  Filled 2018-03-15: qty 2

## 2018-03-15 MED ORDER — CEPHALEXIN 500 MG PO CAPS: 500.0000 mg | ORAL_CAPSULE | Freq: Every day | ORAL | 0 refills | Status: DC

## 2018-03-15 MED ORDER — ONDANSETRON HCL 4 MG/2ML IJ SOLN
INTRAMUSCULAR | Status: DC | PRN
  Administered 2018-03-15: 4 mg via INTRAVENOUS

## 2018-03-15 SURGICAL SUPPLY — 23 items
BAG DRAIN URO-CYSTO SKYTR STRL (DRAIN) ×3 IMPLANT
BAG URINE DRAINAGE (UROLOGICAL SUPPLIES) ×3 IMPLANT
BAG URINE LEG 19OZ MD ST LTX (BAG) IMPLANT
BAG URINE LEG 500ML (DRAIN) IMPLANT
CATH FOLEY 2WAY SLVR  5CC 16FR (CATHETERS)
CATH FOLEY 2WAY SLVR  5CC 18FR (CATHETERS) ×2
CATH FOLEY 2WAY SLVR 5CC 16FR (CATHETERS) IMPLANT
CATH FOLEY 2WAY SLVR 5CC 18FR (CATHETERS) ×1 IMPLANT
CLOTH BEACON ORANGE TIMEOUT ST (SAFETY) ×3 IMPLANT
ELECT REM PT RETURN 9FT ADLT (ELECTROSURGICAL) ×3
ELECTRODE REM PT RTRN 9FT ADLT (ELECTROSURGICAL) ×1 IMPLANT
GLOVE BIO SURGEON STRL SZ7.5 (GLOVE) ×3 IMPLANT
GOWN STRL REUS W/ TWL XL LVL3 (GOWN DISPOSABLE) ×1 IMPLANT
GOWN STRL REUS W/TWL XL LVL3 (GOWN DISPOSABLE) ×2
HOLDER FOLEY CATH W/STRAP (MISCELLANEOUS) ×3 IMPLANT
KIT TURNOVER CYSTO (KITS) ×3 IMPLANT
MANIFOLD NEPTUNE II (INSTRUMENTS) ×3 IMPLANT
NEEDLE HYPO 22GX1.5 SAFETY (NEEDLE) IMPLANT
NS IRRIG 500ML POUR BTL (IV SOLUTION) IMPLANT
PACK CYSTO (CUSTOM PROCEDURE TRAY) ×3 IMPLANT
TUBE CONNECTING 12'X1/4 (SUCTIONS)
TUBE CONNECTING 12X1/4 (SUCTIONS) IMPLANT
WATER STERILE IRR 3000ML UROMA (IV SOLUTION) ×6 IMPLANT

## 2018-03-15 NOTE — ED Triage Notes (Signed)
Pt states he had bladder polyp removed at [facility] surgical center this am-states foley is not draining-NAD-steady gait

## 2018-03-15 NOTE — ED Notes (Signed)
Pt verbalized understanding of dc instructions.

## 2018-03-15 NOTE — H&P (Signed)
H&P  Chief Complaint: Gross hematuria, BPH  History of Present Illness: [name] is an 83 year old white male with a history of gross hematuria and BPH.  He underwent office cystoscopy December 2019 which revealed BPH and 2 small bladder diverticulum.  And 1 or both of the diverticulum there was some erythematous mucosa and bleeding.  He underwent CT scan of the abdomen and pelvis July 16, 2017 which was repeated January 15, 2018 and revealed the 2 right anterior diverticulum with some enhancement of the mucosa.  He was brought today for cystoscopy, exam under anesthesia, bladder biopsy and possible TURP or TURBT.  He has been well without cough, colds fever or congestion.  No dysuria.  He did note some red urine last week but otherwise the urine has been clear.  Past Medical History:  Diagnosis Date  . Aortic atherosclerosis ([place]) 07/16/2017   Noted on CT Abd/pelvis  . Atrial flutter by electrocardiogram ([place]) 01/01/2018  . Hematuria   . Hematuria   . History of cataract    Bilateral  . Hypertension    Resolved, no longer taking Lisinopril  . Left inguinal hernia 07/16/2017   Noted on CT Abd/pelvis  . Persistent atrial fibrillation    Asymptomatic  . Pulmonary hypertension ([place]) 07/04/2017   Mild, noted on ECHO  . Seasonal allergies   . Sigmoid diverticulosis 07/16/2017   Noted on CT Abd/pelvis  . Skin cancer    Forehead and edge of ears  . UTI (urinary tract infection)    Past Surgical History:  Procedure Laterality Date  . CARDIOVERSION N/A 09/11/2017   Procedure: CARDIOVERSION;  Surgeon: Pixie Casino, MD;  Location: [facility] ENDOSCOPY;  Service: Cardiovascular;  Laterality: N/A;  . CATARACT EXTRACTION, BILATERAL  06/2017, 07/2017  . COLONOSCOPY    . TYMPANOPLASTY Left 1970's    Home Medications:  Medications Prior to Admission  Medication Sig Dispense Refill Last Dose  . alfuzosin (UROXATRAL) 10 MG 24 hr tablet Take 10 mg by mouth daily with breakfast.   03/15/2018 at 0600   . Cholecalciferol (VITAMIN D) 2000 units CAPS Take 2,000 Units by mouth daily.    Past Week at Unknown time  . doxycycline (ADOXA) 50 MG tablet Take 10 mg by mouth as needed. For rash on scalp   Past Month at Unknown time  . finasteride (PROSCAR) 5 MG tablet Take 5 mg by mouth every morning.    03/15/2018 at 0600  . loratadine (CLARITIN) 10 MG tablet Take 10 mg by mouth daily.   Past Month at Unknown time  . Misc Natural Products (PROSTATE HEALTH PO) Take 1 tablet by mouth daily.   Past Month at Unknown time  . Multiple Vitamin (MULTIVITAMIN) tablet Take 1 tablet by mouth daily.   Past Month at Unknown time  . Multiple Vitamins-Minerals (PRESERVISION AREDS 2 PO) Take 1 capsule by mouth 2 (two) times daily.    Past Month at Unknown time  . apixaban (ELIQUIS) 5 MG TABS tablet Take 1 tablet (5 mg total) by mouth 2 (two) times daily. 60 tablet 6 03/10/2018   Allergies: No Known Allergies  History reviewed. No pertinent family history. Social History:  reports that he quit smoking about 52 years ago. He has never used smokeless tobacco. He reports current alcohol use. He reports that he does not use drugs.  ROS: A complete review of systems was performed.  All systems are negative except for pertinent findings as noted. Review of Systems  All other systems reviewed and  are negative.    Physical Exam:  Vital signs in last 24 hours: Temp:  [97.6 F (36.4 C)] 97.6 F (36.4 C) (01/31 0736) Pulse Rate:  [88] 88 (01/31 0736) Resp:  [16] 16 (01/31 0736) BP: (135)/(89) 135/89 (01/31 0736) SpO2:  [100 %] 100 % (01/31 0736) Weight:  [88.1 kg] 88.1 kg (01/31 0736) General:  Alert and oriented, No acute distress HEENT: Normocephalic, atraumatic Cardiovascular: Regular rate and rhythm Lungs: Regular rate and effort Abdomen: Soft, nontender, nondistended, no abdominal masses Back: No CVA tenderness Extremities: No edema Neurologic: Grossly intact  Laboratory Data:  Results for orders placed or  performed during the hospital encounter of 03/15/18 (from the past 24 hour(s))  I-STAT 4, (NA,K, GLUC, HGB,HCT)     Status: Abnormal   Collection Time: 03/15/18  8:06 AM  Result Value Ref Range   Sodium 143 135 - 145 mmol/L   Potassium 4.1 3.5 - 5.1 mmol/L   Glucose, Bld 120 (H) 70 - 99 mg/dL   HCT 38.0 (L) 39.0 - 52.0 %   Hemoglobin 12.9 (L) 13.0 - 17.0 g/dL   No results found for this or any previous visit (from the past 240 hour(s)). Creatinine: No results for input(s): CREATININE in the last 168 hours.  Impression/Assessment:  BPH, gross hematuria, bladder diverticulum, bladder erythema-  Plan:  I discussed with the patient the nature, potential benefits, risks and alternatives to cystoscopy with bladder biopsy and fulguration possible TURP, possible TURBT, including side effects of the proposed treatment, the likelihood of the patient achieving the goals of the procedure, and any potential problems that might occur during the procedure or recuperation. All questions answered. Patient elects to proceed.    Festus Aloe 03/15/2018, 9:21 AM

## 2018-03-15 NOTE — Progress Notes (Signed)
Pt  Stated " I don't remember being told" to have a responsible adult with him for overnight after surgery. Eduard Clos stated he would make sure that the pt has someone to stay with him.

## 2018-03-15 NOTE — Anesthesia Postprocedure Evaluation (Signed)
Anesthesia Post Note  Patient: BENTLEIGH STANKUS  Procedure(s) Performed: CYSTOSCOPY WITH BIOPSY/ FULGURATION (N/A Bladder)     Patient location during evaluation: PACU Anesthesia Type: General Level of consciousness: awake and alert Pain management: pain level controlled Vital Signs Assessment: post-procedure vital signs reviewed and stable Respiratory status: spontaneous breathing, nonlabored ventilation and respiratory function stable Cardiovascular status: blood pressure returned to baseline and stable Postop Assessment: no apparent nausea or vomiting Anesthetic complications: no    Last Vitals:  Vitals:   03/15/18 1045 03/15/18 1100  BP: (!) 130/94 (!) 143/93  Pulse: 60 (!) 58  Resp: 18 11  Temp:    SpO2: 98% 96%    Last Pain:  Vitals:   03/15/18 1100  TempSrc:   PainSc: 0-No pain                 Audry Pili

## 2018-03-15 NOTE — Transfer of Care (Signed)
Immediate Anesthesia Transfer of Care Note  Patient: Manuel Moreno  Procedure(s) Performed: CYSTOSCOPY WITH BIOPSY/ FULGURATION (N/A Bladder)  Patient Location: PACU  Anesthesia Type:General  Level of Consciousness: awake, alert  and oriented  Airway & Oxygen Therapy: Patient Spontanous Breathing and Patient connected to nasal cannula oxygen  Post-op Assessment: Report given to RN and Post -op Vital signs reviewed and stable  Post vital signs: Reviewed and stable  Last Vitals:  Vitals Value Taken Time  BP    Temp    Pulse    Resp    SpO2      Last Pain:  Vitals:   03/15/18 0758  TempSrc:   PainSc: 0-No pain      Patients Stated Pain Goal: 5 (41/99/14 4458)  Complications: No apparent anesthesia complications

## 2018-03-15 NOTE — ED Notes (Addendum)
Foley irrigated with 180 ml saline, one small clot removed, no resistance met. Pt tolerated well

## 2018-03-15 NOTE — Anesthesia Procedure Notes (Signed)
Procedure Name: LMA Insertion Date/Time: 03/15/2018 9:36 AM Performed by: Jonna Munro, CRNA Pre-anesthesia Checklist: Patient identified, Emergency Drugs available, Suction available, Patient being monitored and Timeout performed Patient Re-evaluated:Patient Re-evaluated prior to induction Oxygen Delivery Method: Circle system utilized Preoxygenation: Pre-oxygenation with 100% oxygen Induction Type: IV induction LMA: LMA inserted LMA Size: 4.0 Number of attempts: 1 Placement Confirmation: positive ETCO2 and breath sounds checked- equal and bilateral Dental Injury: Teeth and Oropharynx as per pre-operative assessment

## 2018-03-15 NOTE — Op Note (Signed)
Preoperative diagnosis: BPH, gross hematuria, bladder diverticulum, bladder erythema Postoperative diagnosis: Same  Procedure: Cystoscopy with bladder biopsy and fulguration 0.5 to 2 cm, exam under anesthesia  Surgeon: Junious Silk  Anesthesia: General  Indication for procedure: 83 year old male with gross hematuria who was noted to have enhancement of anterior bladder diverticulum and erythema on cystoscopy.  He was brought for biopsies.  Findings: On exam under anesthesia the penis was uncircumcised, there was no phimosis and the foreskin was normal.  Penis normal.  The scrotum was normal.  Testicles descended bilaterally and palpably normal.  There was a palpable left inguinal hernia.  This seemed to be reducible.  On digital rectal exam the prostate was about 30 to 40 g, smooth without hard area or nodule.  On cystoscopy the urethra was tight and the 22 [place] scope dilated to access the bladder.  Prostate had moderate lateral lobe hypertrophy and obstruction.  Bladder was moderately trabeculated.  No stone or foreign body in the bladder.  Bladder mucosa appeared normal and there were no papillary lesions.  The only abnormality was in the 2 anterior diverticulum the surface appeared erythematous and almost ulcerated like a Hunner ulcer.  Description of procedure: After consent was obtained patient brought to the operating room.  After adequate anesthesia was placed in lithotomy position and prepped and draped in the usual sterile fashion.  A timeout was performed to confirm the patient and procedure.  An exam under anesthesia was performed.  The cystoscope was passed per urethra and the bladder carefully inspected with a 30 degree and 70 degree lens.  I then advanced the scope into the right diverticulum and used a 9 French flexible biopsy forceps and biopsied the diverticulum.  The diverticulum biopsy site and lumen were fulgurated.  This was labeled as the right one I then biopsied the left one and  fulgurated the lumen as well.  At least 2 cm was fulgurated inside each diverticulum. It was somewhat tricky to get into these diverticulum with the right amount of bladder volume and suprapubic pressure needed.  Hemostasis was excellent at low pressure.  The scope was removed and an 27 French catheter placed in left to gravity drainage.  Drainage was clear.  He was awakened taken recovery room in stable condition.  Complications: None  Blood loss: Minimal  Specimens to pathology: #1 right anterior bladder diverticulum biopsy #2 left anterior bladder diverticulum biopsy  Drains: 18 French Foley  Disposition: Patient stable to PACU

## 2018-03-15 NOTE — Progress Notes (Signed)
Bloody drainage from penis upon standing. Cleaned and mesh briefs and peripad applied.

## 2018-03-15 NOTE — Anesthesia Preprocedure Evaluation (Addendum)
Anesthesia Evaluation  Patient identified by MRN, date of birth, ID band Patient awake    Reviewed: Allergy & Precautions, NPO status , Patient's Chart, lab work & pertinent test results  History of Anesthesia Complications Negative for: history of anesthetic complications  Airway Mallampati: I  TM Distance: >3 FB Neck ROM: Full    Dental  (+) Dental Advisory Given, Teeth Intact   Pulmonary former smoker,    breath sounds clear to auscultation       Cardiovascular Exercise Tolerance: Good (-) angina+ dysrhythmias Atrial Fibrillation  Rhythm:Irregular Rate:Normal   Previously hypertensive, no longer on meds  '19 TTE - EF 50%. Diffuse hypokinesis. LA was moderately dilated. RA was mildly dilated. PA peak pressure: 39 mm Hg     Neuro/Psych negative neurological ROS  negative psych ROS   GI/Hepatic negative GI ROS, Neg liver ROS,   Endo/Other   Obesity   Renal/GU negative Renal ROS     Musculoskeletal negative musculoskeletal ROS (+)   Abdominal   Peds  Hematology negative hematology ROS (+)   Anesthesia Other Findings   Reproductive/Obstetrics                            Anesthesia Physical Anesthesia Plan  ASA: II  Anesthesia Plan: General   Post-op Pain Management:    Induction: Intravenous  PONV Risk Score and Plan: 2 and Treatment may vary due to age or medical condition and Ondansetron  Airway Management Planned: LMA  Additional Equipment: None  Intra-op Plan:   Post-operative Plan: Extubation in OR  Informed Consent: I have reviewed the patients History and Physical, chart, labs and discussed the procedure including the risks, benefits and alternatives for the proposed anesthesia with the patient or authorized representative who has indicated his/her understanding and acceptance.     Dental advisory given  Plan Discussed with: CRNA and  Anesthesiologist  Anesthesia Plan Comments:        Anesthesia Quick Evaluation

## 2018-03-15 NOTE — ED Provider Notes (Signed)
[facility] EMERGENCY DEPARTMENT Provider Note   CSN: [id number] Arrival date & time: 03/15/18  1726     History   Chief Complaint Chief Complaint  Patient presents with  . Foley not draining    HPI Manuel Moreno is a 83 y.o. male.  HPI   Patient is an 83 year old male with a history of aortic atherosclerosis, a flutter, hematuria, hypertension, inguinal hernia, UTI, who presents the emergency department today for evaluation of a Foley catheter problem. Patient had a bladder polyp removed at [facility] surgical center this morning with Dr. Junious Silk.  States he was told he might have some bloody urine following the procedure.  States it was draining when he got home initially however he is not draining any urine in several hours.  He does endorse some bladder fullness.  Denies other symptoms at this time.  He attempted to call the urology office and was told to come to the ED.  Past Medical History:  Diagnosis Date  . Aortic atherosclerosis ([place]) 07/16/2017   Noted on CT Abd/pelvis  . Atrial flutter by electrocardiogram ([place]) 01/01/2018  . Hematuria   . Hematuria   . History of cataract    Bilateral  . Hypertension    Resolved, no longer taking Lisinopril  . Left inguinal hernia 07/16/2017   Noted on CT Abd/pelvis  . Persistent atrial fibrillation    Asymptomatic  . Pulmonary hypertension ([place]) 07/04/2017   Mild, noted on ECHO  . Seasonal allergies   . Sigmoid diverticulosis 07/16/2017   Noted on CT Abd/pelvis  . Skin cancer    Forehead and edge of ears  . UTI (urinary tract infection)     Patient Active Problem List   Diagnosis Date Noted  . Typical atrial flutter [facility])     Past Surgical History:  Procedure Laterality Date  . CARDIOVERSION N/A 09/11/2017   Procedure: CARDIOVERSION;  Surgeon: Pixie Casino, MD;  Location: [facility] ENDOSCOPY;  Service: Cardiovascular;  Laterality: N/A;  . CATARACT EXTRACTION, BILATERAL  06/2017, 07/2017  . COLONOSCOPY      . TYMPANOPLASTY Left 1970's        Home Medications    Prior to Admission medications   Medication Sig Start Date End Date Taking? Authorizing Provider  alfuzosin (UROXATRAL) 10 MG 24 hr tablet Take 10 mg by mouth daily with breakfast.    [provider]  apixaban (ELIQUIS) 5 MG TABS tablet Take 1 tablet (5 mg total) by mouth 2 (two) times daily. 03/18/18   Festus Aloe, MD  cephALEXin (KEFLEX) 500 MG capsule Take 1 capsule (500 mg total) by mouth at bedtime. 03/15/18   Festus Aloe, MD  Cholecalciferol (VITAMIN D) 2000 units CAPS Take 2,000 Units by mouth daily.     [provider]  doxycycline (ADOXA) 50 MG tablet Take 10 mg by mouth as needed. For rash on scalp    [provider]  finasteride (PROSCAR) 5 MG tablet Take 5 mg by mouth every morning.     [provider]  loratadine (CLARITIN) 10 MG tablet Take 10 mg by mouth daily.    [provider]  Misc Natural Products (PROSTATE HEALTH PO) Take 1 tablet by mouth daily.    [provider]  Multiple Vitamin (MULTIVITAMIN) tablet Take 1 tablet by mouth daily.    [provider]  Multiple Vitamins-Minerals (PRESERVISION AREDS 2 PO) Take 1 capsule by mouth 2 (two) times daily.     [provider]  Family History No family history on file.  Social History Social History   Tobacco Use  . Smoking status: Former Smoker    Last attempt to quit: 1968    Years since quitting: 52.1  . Smokeless tobacco: Never Used  Substance Use Topics  . Alcohol use: Yes    Comment: very occasional  . Drug use: Never     Allergies   Patient has no known allergies.   Review of Systems Review of Systems  Constitutional: Negative for fever.  Respiratory: Negative for shortness of breath.   Cardiovascular: Negative for chest pain.  Gastrointestinal: Negative for abdominal pain, constipation, diarrhea, nausea and vomiting.  Genitourinary: Positive for hematuria.        Foley not draining, bladder fullness  Musculoskeletal: Negative for back pain.  Skin: Negative for rash.  Neurological: Negative for headaches.     Physical Exam Updated Vital Signs BP (!) 153/96 (BP Location: Left Arm)   Pulse 62   Temp 98.3 F (36.8 C) (Oral)   Resp 20   Ht 5\' 7"  (1.702 m)   Wt 88 kg   SpO2 99%   BMI 30.38 kg/m   Physical Exam Vitals signs and nursing note reviewed.  Constitutional:      General: He is not in acute distress.    Appearance: He is well-developed.     Comments: Foley catheter evaluated.  Hematuria noted in catheter bag.  HENT:     Head: Normocephalic and atraumatic.  Eyes:     Conjunctiva/sclera: Conjunctivae normal.  Cardiovascular:     Rate and Rhythm: Normal rate and regular rhythm.  Pulmonary:     Effort: Pulmonary effort is normal.     Breath sounds: Normal breath sounds.  Abdominal:     General: There is no distension.     Tenderness: There is no abdominal tenderness. There is no guarding or rebound.  Skin:    General: Skin is warm and dry.  Neurological:     Mental Status: He is alert and oriented to person, place, and time.  Psychiatric:        Mood and Affect: Mood normal.      ED Treatments / Results  Labs (all labs ordered are listed, but only abnormal results are displayed) Labs Reviewed - No data to display  EKG None  Radiology No results found.  Procedures Procedures (including critical care time)  Medications Ordered in ED Medications - No data to display   Initial Impression / Assessment and Plan / ED Course  I have reviewed the triage vital signs and the nursing notes.  Pertinent labs & imaging results that were available during my care of the patient were reviewed by me and considered in my medical decision making (see chart for details).     Final Clinical Impressions(s) / ED Diagnoses   Final diagnoses:  Encounter for evaluation of Foley catheter   Patient is an 83 year old male here  for evaluation of a Foley catheter problem. Patient had a bladder polyp removed at [facility] this morning with Dr. Junious Silk.  States he was told he might have some bloody urine following the procedure.  States it was draining when he got home initially however he is not draining any urine in several hours.  He does endorse some bladder fullness.  Denies other symptoms at this time.    Vitals wnl. abd soft and nontender. Dark hematuria noted in foley bag. Small amount of bleeding at the meatus. Foley catheter  was flushed and a small clot was noted. Urine began draining normally following this. Subsequent bladder scan completed showed 46cc in bladder. Pt feels improved. He is stable for d/c to f/u with urology later this week. Return precautions discussed.  He voices understanding of the plan reasons return to all questions answered.   ED Discharge Orders    None       Bishop Dublin 03/15/18 1224    SLPN, PYYFRT M, MD 03/16/18 [phone]

## 2018-03-15 NOTE — Discharge Instructions (Signed)
Indwelling Urinary Catheter Care, Adult An indwelling urinary catheter is a thin tube that is put into your bladder. The tube helps to drain pee (urine) out of your body. The tube goes in through your urethra. Your urethra is where pee comes out of your body. Your pee will come out through the catheter, then it will go into a bag (drainage bag). Take good care of your catheter so it will work well. How to wear your catheter and bag Supplies needed  Sticky tape (adhesive tape) or a leg strap.  Alcohol wipe or soap and water (if you use tape).  A clean towel (if you use tape).  Large overnight bag.  Smaller bag (leg bag). Wearing your catheter Attach your catheter to your leg with tape or a leg strap.  Make sure the catheter is not pulled tight.  If a leg strap gets wet, take it off and put on a dry strap.  If you use tape to hold the bag on your leg: 1. Use an alcohol wipe or soap and water to wash your skin where the tape made it sticky before. 2. Use a clean towel to pat-dry that skin. 3. Use new tape to make the bag stay on your leg. Wearing your bags You should have been given a large overnight bag.  You may wear the overnight bag in the day or night.  Always have the overnight bag lower than your bladder.  Do not let the bag touch the floor.  Before you go to sleep, put a clean plastic bag in a wastebasket. Then hang the overnight bag inside the wastebasket. You should also have a smaller leg bag that fits under your clothes.  Always wear the leg bag below your knee.  Do not wear your leg bag at night. How to care for your skin and catheter Supplies needed  A clean washcloth.  Water and mild soap.  A clean towel. Caring for your skin and catheter      Clean the skin around your catheter every day: ? Wash your hands with soap and water. ? Wet a clean washcloth in warm water and mild soap. ? Clean the skin around your urethra. ? If you are male: ? Gently  spread the folds of skin around your vagina (labia). ? With the washcloth in your other hand, wipe the inner side of your labia on each side. Wipe from front to back. ? If you are male: ? Pull back any skin that covers the end of your penis (foreskin). ? With the washcloth in your other hand, wipe your penis in small circles. Start wiping at the tip of your penis, then move away from the catheter. ? With your free hand, hold the catheter close to where it goes into your body. ? Keep holding the catheter during cleaning so it does not get pulled out. ? With the washcloth in your other hand, clean the catheter. ? Only wipe downward on the catheter. ? Do not wipe upward toward your body. Doing this may push germs into your urethra and cause infection. ? Use a clean towel to pat-dry the catheter and the skin around it. Make sure to wipe off all soap. ? Wash your hands with soap and water.  Shower every day. Do not take baths.  Do not use cream, ointment, or lotion on the area where the catheter goes into your body, unless your doctor tells you to.  Do not use powders, sprays, or lotions  on your genital area.  Check your skin around the catheter every day for signs of infection. Check for: ? Redness, swelling, or pain. ? Fluid or blood. ? Warmth. ? Pus or a bad smell. How to empty the bag Supplies needed  Rubbing alcohol.  Gauze pad or cotton ball.  Tape or a leg strap. Emptying the bag Pour the pee out of your bag when it is ?- full, or at least 2-3 times a day. Do this for your overnight bag and your leg bag. 1. Wash your hands with soap and water. 2. Separate (detach) the bag from your leg. 3. Hold the bag over the toilet or a clean pail. Keep the bag lower than your hips and bladder. This is so the pee (urine) does not go back into the tube. 4. Open the pour spout. It is at the bottom of the bag. 5. Empty the pee into the toilet or pail. Do not let the pour spout touch any  surface. 6. Put rubbing alcohol on a gauze pad or cotton ball. 7. Use the gauze pad or cotton ball to clean the pour spout. 8. Close the pour spout. 9. Attach the bag to your leg with tape or a leg strap. 10. Wash your hands with soap and water. Follow instructions for cleaning the drainage bag:  From the product maker.  As told by your doctor. How to change the bag Supplies needed  Alcohol wipes.  A clean bag.  Tape or a leg strap. Changing the bag Replace your bag with a clean bag once a month. If it starts to leak, smell bad, or look dirty, change it sooner. 1. Wash your hands with soap and water. 2. Separate the dirty bag from your leg. 3. Pinch the catheter with your fingers so that pee does not spill out. 4. Separate the catheter tube from the bag tube where these tubes connect (at the connection valve). Do not let the tubes touch any surface. 5. Clean the end of the catheter tube with an alcohol wipe. Use a different alcohol wipe to clean the end of the bag tube. 6. Connect the catheter tube to the tube of the clean bag. 7. Attach the clean bag to your leg with tape or a leg strap. Do not make the bag tight on your leg. 8. Wash your hands with soap and water. General rules   Never pull on your catheter. Never try to take it out. Doing that can hurt you.  Always wash your hands before and after you touch your catheter or bag. Use a mild, fragrance-free soap. If you do not have soap and water, use hand sanitizer.  Always make sure there are no twists or bends (kinks) in the catheter tube.  Always make sure there are no leaks in the catheter or bag.  Drink enough fluid to keep your pee pale yellow.  Do not take baths, swim, or use a hot tub.  If you are male, wipe from front to back after you poop (have a bowel movement). Contact a doctor if:  Your pee is cloudy.  Your pee smells worse than usual.  Your catheter gets clogged.  Your catheter leaks.  Your  bladder feels full. Get help right away if:  You have redness, swelling, or pain where the catheter goes into your body.  You have fluid, blood, pus, or a bad smell coming from the area where the catheter goes into your body.  Your skin feels warm  where the catheter goes into your body.  You have a fever.  You have pain in your: ? Belly (abdomen). ? Legs. ? Lower back. ? Bladder.  You see blood in the catheter.  Your pee is pink or red.  You feel sick to your stomach (nauseous).  You throw up (vomit).  You have chills.  Your pee is not draining into the bag.  Your catheter gets pulled out. Summary  An indwelling urinary catheter is a thin tube that is placed into the bladder to help drain pee (urine) out of the body.  The catheter is placed into the part of the body that drains pee from the bladder (urethra).  Taking good care of your catheter will keep it working properly and help prevent problems.  Always wash your hands before and after touching your catheter or bag.  Never pull on your catheter or try to take it out. This information is not intended to replace advice given to you by your health care provider. Make sure you discuss any questions you have with your health care provider. Document Released: 05/27/2012 Document Revised: 07/23/2017 Document Reviewed: 09/15/2016 Elsevier Interactive Patient Education  2019 [employer]. Bladder Biopsy, Care After Refer to this sheet in the next few weeks. These instructions provide you with information about caring for yourself after your procedure. Your health care provider may also give you more specific instructions. Your treatment has been planned according to current medical practices, but problems sometimes occur. Call your health care provider if you have any problems or questions after your procedure. What can I expect after the procedure? After the procedure, it is common to have:  Mild pain in your bladder or  kidney area during urination.  Minor burning during urination.  Small amounts of blood in your urine.  A sudden urge to urinate.  A need to urinate more often than usual. Follow these instructions at home: Medicines  Take over-the-counter and prescription medicines only as told by your health care provider.  If you were prescribed an antibiotic medicine, take it as told by your health care provider. Do not stop taking the antibiotic even if you start to feel better. General instructions   Take a warm bath to relieve any burning sensations around your urethra.  Hold a warm, damp washcloth over the urethral area to ease pain.  Return to your normal activities as told by your health care provider. Ask your health care provider what activities are safe for you.  Do not drive for 24 hours if you received a medicine to help you relax (sedative) during your procedure. Ask your health care provider when it is safe for you to drive.  It is your responsibility to get the results of your procedure. Ask your health care provider or the department performing the procedure when your results will be ready.  Keep all follow-up visits as told by your health care provider. This is important. Contact a health care provider if:  You have a fever.  Your symptoms do not improve within 24 hours and you continue to have: ? Burning during urination. ? Increasing amounts of blood in your urine. ? Pain during urination. ? An urgent need to urinate. ? A need to urinate more often than usual. Get help right away if:  You have a lot of bleeding or more bleeding.  You have severe pain.  You are unable to urinate.  You have bright red blood in your urine.  You are passing  blood clots in your urine.  You have a fever.  You have swelling, redness, or pain in your legs.  You have difficulty breathing. This information is not intended to replace advice given to you by your health care provider.  Make sure you discuss any questions you have with your health care provider. Document Released: 02/16/2015 Document Revised: 07/08/2015 Document Reviewed: 02/16/2015 Elsevier Interactive Patient Education  2019 [place] [facility] Instructions  Activity: Get plenty of rest for the remainder of the day. A responsible individual must stay with you for 24 hours following the procedure.  For the next 24 hours, DO NOT: -Drive a car -Paediatric nurse -Drink alcoholic beverages -Take any medication unless instructed by your physician -Make any legal decisions or sign important papers.  Meals: Start with liquid foods such as gelatin or soup. Progress to regular foods as tolerated. Avoid greasy, spicy, heavy foods. If nausea and/or vomiting occur, drink only clear liquids until the nausea and/or vomiting subsides. Call your physician if vomiting continues.  Special Instructions/Symptoms: Your throat may feel dry or sore from the anesthesia or the breathing tube placed in your throat during surgery. If this causes discomfort, gargle with warm salt water. The discomfort should disappear within 24 hours.

## 2018-03-15 NOTE — Discharge Instructions (Signed)
These keep your appoint with [facility] later this week for evaluation.  If your catheter becomes clogged again or if you have any new or worsening symptoms he may return the emergency department for evaluation.

## 2018-03-18 ENCOUNTER — Encounter (HOSPITAL_BASED_OUTPATIENT_CLINIC_OR_DEPARTMENT_OTHER): Payer: Self-pay | Admitting: Urology

## 2018-05-01 ENCOUNTER — Ambulatory Visit (HOSPITAL_COMMUNITY): Payer: Medicare Other | Admitting: Nurse Practitioner

## 2018-06-05 ENCOUNTER — Ambulatory Visit (HOSPITAL_COMMUNITY): Payer: Medicare Other | Admitting: Nurse Practitioner

## 2018-07-06 IMAGING — CT CT ABD-PELV W/O CM
2 of 4 series · 14 of 46 positions shown, 16 images · non-contrast
Comparison: None.

CLINICAL DATA: Unspecified hematuria

EXAM:
CT ABDOMEN AND PELVIS WITHOUT CONTRAST
TECHNIQUE: Multidetector CT imaging of the abdomen and pelvis was performed
following the standard protocol without IV contrast.

[Series 2: renal stone 5.00 br40 s3 ax · axial · 0.70mm/px · z∈[+1048,+1437]mm · 11 of 88 slices shown, 13 images]
[im 5/88  soft-tissue]
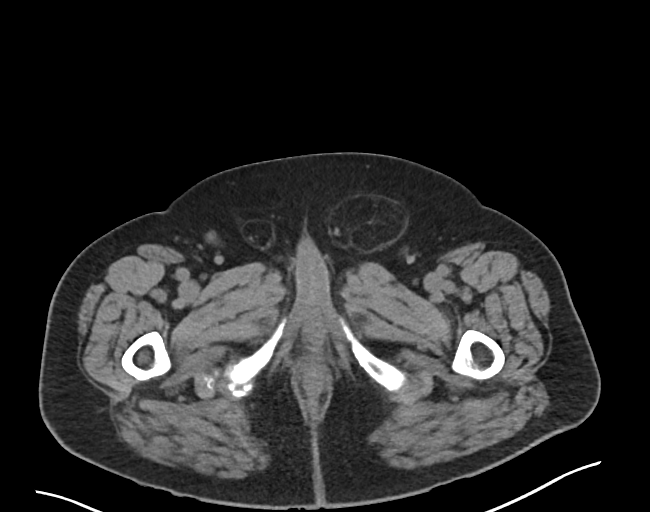
[im 5/88  bone]
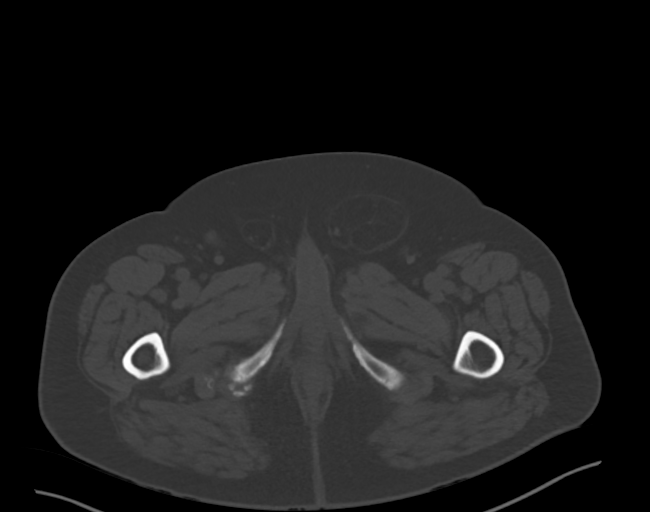
[im 14/88  soft-tissue]
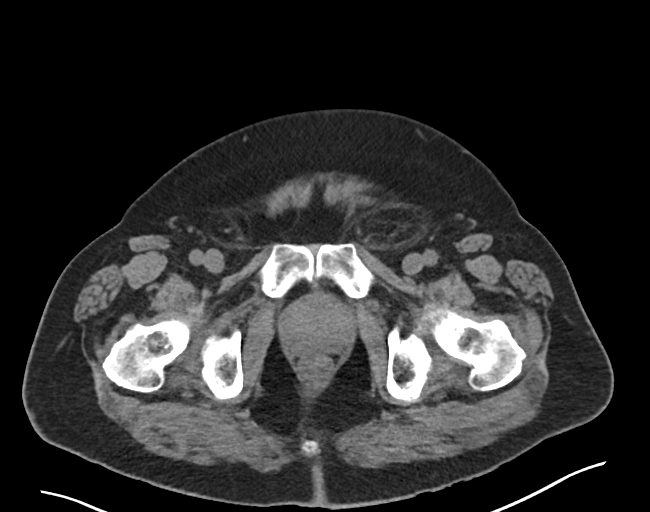
[im 22/88  soft-tissue]
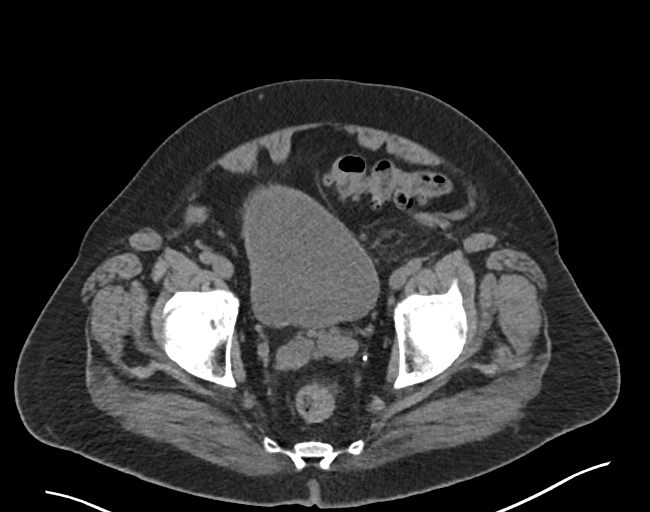
[im 31/88  soft-tissue]
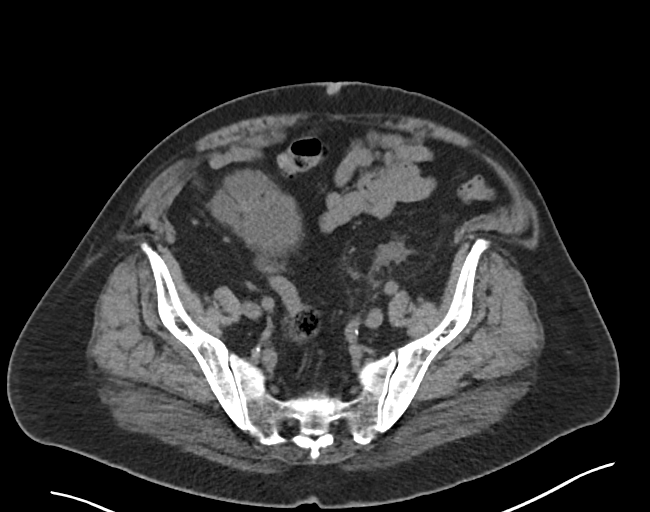
[im 35/88  soft-tissue]
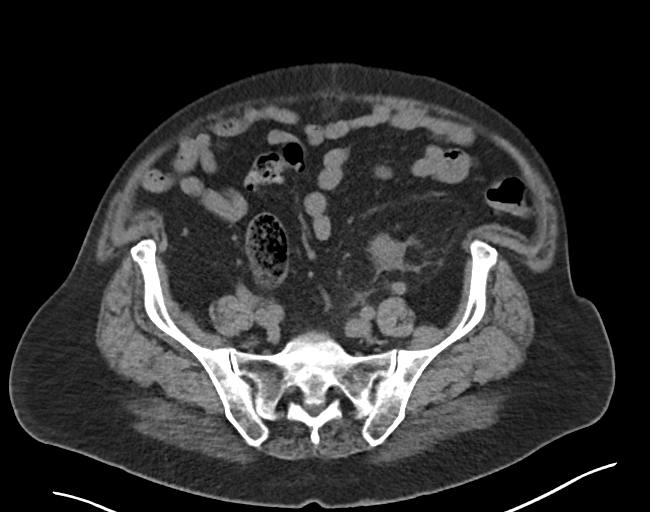
[im 44/88  soft-tissue]
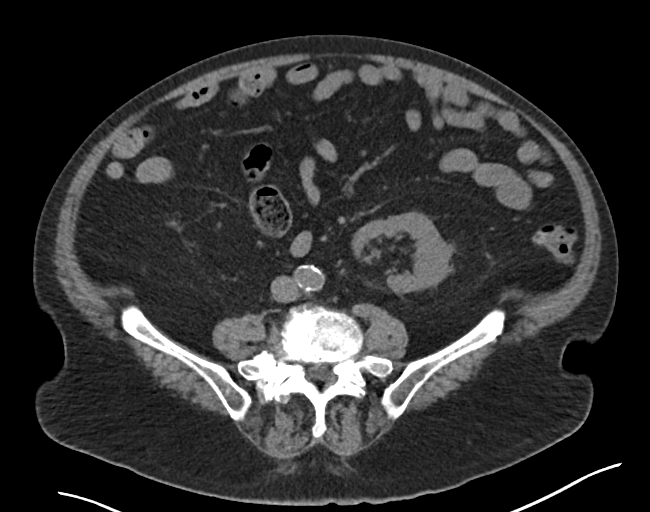
[im 53/88  soft-tissue]
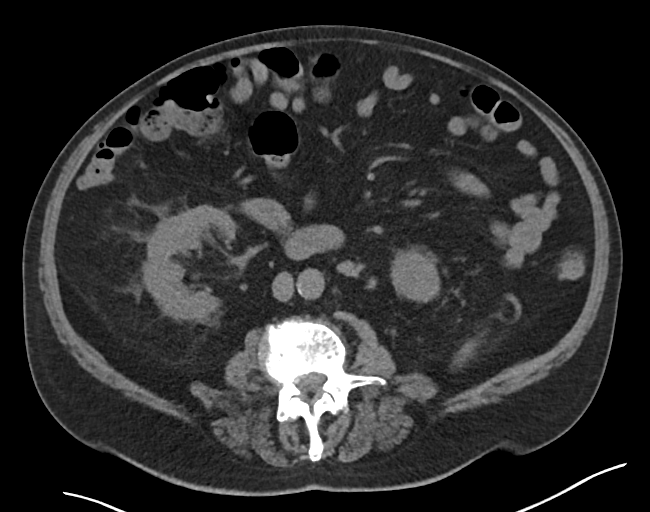
[im 57/88  soft-tissue]
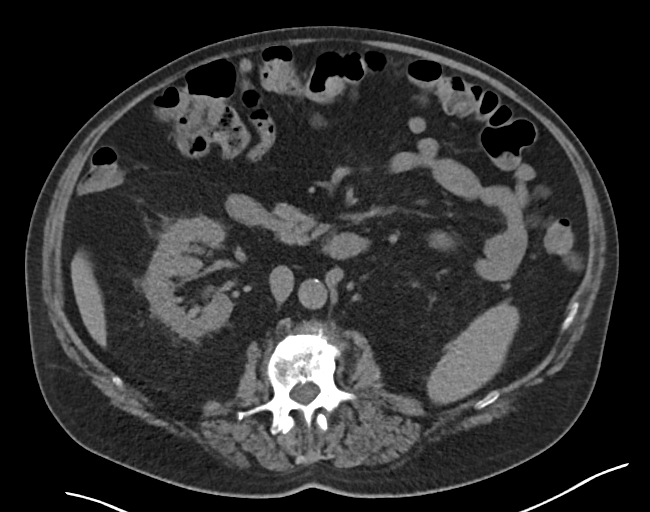
[im 66/88  soft-tissue]
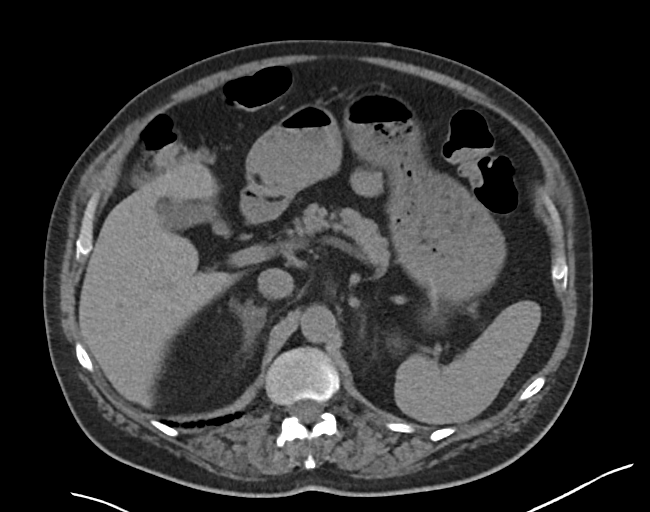
[im 66/88  bone]
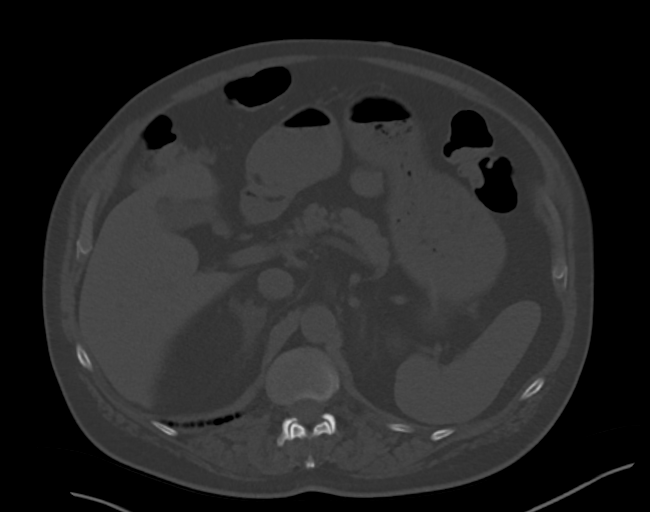
[im 74/88  soft-tissue]
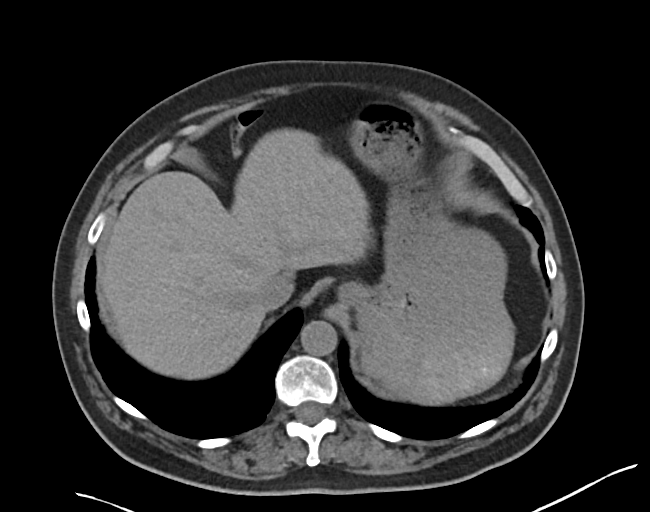
[im 83/88  soft-tissue]
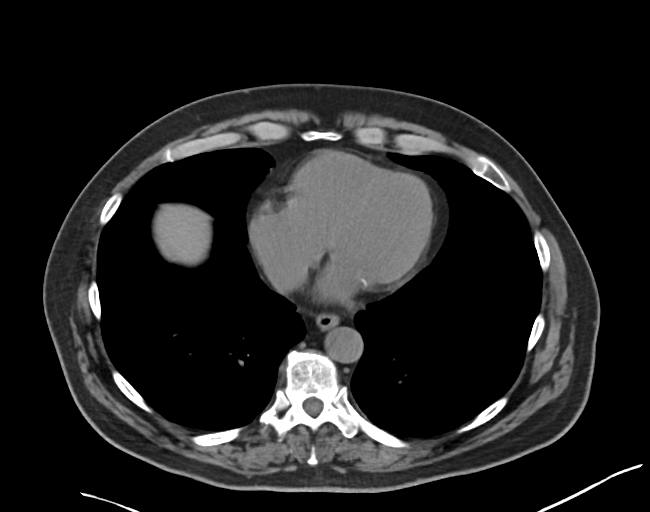

[Series 6: renal stone 2.00 br40 s3 cor · coronal · 0.84mm/px · 3 of 169 slices shown]
[im 57/169  soft-tissue]
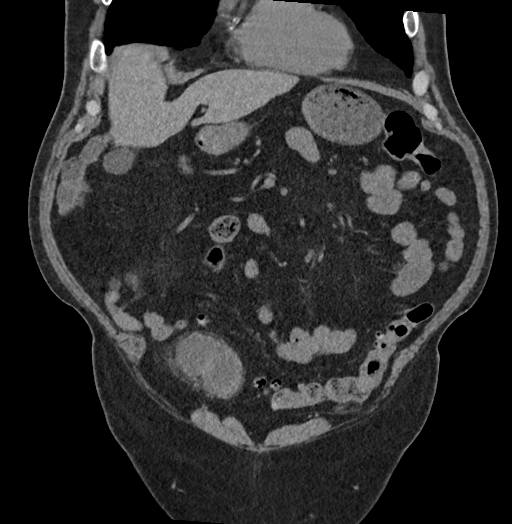
[im 75/169  soft-tissue]
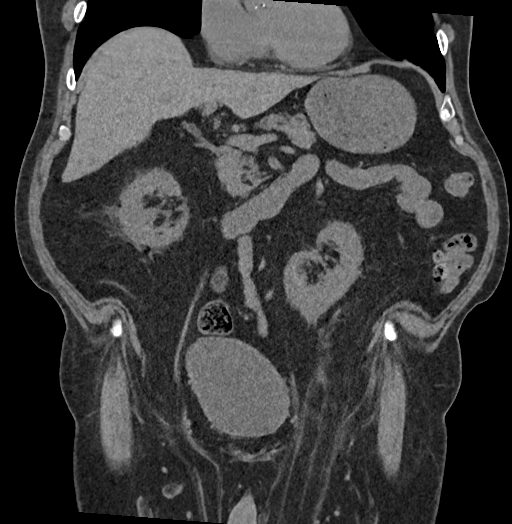
[im 94/169  soft-tissue]
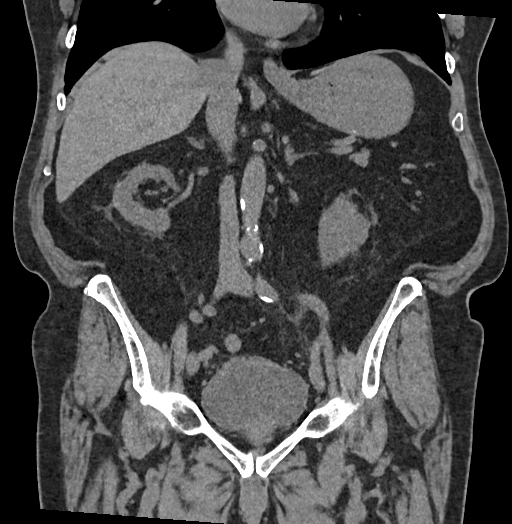

[14 of 46 positions shown; findings below may reference images not displayed]

FINDINGS: Lower chest: Coronary atherosclerotic calcifications seen in the
RCA.

Hepatobiliary: No focal liver abnormality.No evidence of biliary
obstruction or stone.

Pancreas: Unremarkable.

Spleen: Unremarkable.

Adrenals/Urinary Tract: Mild lobulation of the right adrenal gland
without discrete nodule. No hydronephrosis or stone. Moderately
distended bladder diverticula at the apex. There is mild fat
stranding around the diverticula. No visible filling defect/mass,
but limited by lack of IV contrast.

Stomach/Bowel: No obstruction or inflammatory change. Sigmoid
diverticulosis. Sequela of prior epiploic appendagitis along the
sigmoid colon.

Vascular/Lymphatic: Atherosclerotic calcification. No mass or
adenopathy.

Reproductive:Mild prostate enlargement with nonspecific central
high-density nodular component.

Other: Fatty left inguinal hernia. There is fatty enlargement of the
right inguinal canal.

Musculoskeletal: No acute or aggressive finding. Advanced lumbar
disc degeneration mild scoliosis.
IMPRESSION: 1. Two bladder diverticula with regional fat stranding, question
cystitis or bladder diverticulitis. Negative for urolithiasis or
hydronephrosis.
2. Fatty left inguinal hernia.
3.  Aortic Atherosclerosis ([id number]-HI1.1).  Coronary atherosclerosis.
4. Sigmoid diverticulosis.

## 2018-07-09 ENCOUNTER — Encounter (HOSPITAL_COMMUNITY): Payer: Self-pay | Admitting: Nurse Practitioner

## 2018-07-09 ENCOUNTER — Other Ambulatory Visit: Payer: Self-pay

## 2018-07-09 ENCOUNTER — Ambulatory Visit (HOSPITAL_COMMUNITY)
Admission: RE | Admit: 2018-07-09 | Discharge: 2018-07-09 | Disposition: A | Discharge status: Home / Self Care | Payer: Medicare Other | Source: Ambulatory Visit | Attending: Nurse Practitioner | Admitting: Nurse Practitioner

## 2018-07-09 VITALS — BP 132/64 | HR 72 | Ht 67.0 in | Wt 203.4 lb

## 2018-07-09 DIAGNOSIS — Z7901 Long term (current) use of anticoagulants: Secondary | ICD-10-CM | POA: Diagnosis not present

## 2018-07-09 DIAGNOSIS — Z79899 Other long term (current) drug therapy: Secondary | ICD-10-CM | POA: Diagnosis not present

## 2018-07-09 DIAGNOSIS — Z87891 Personal history of nicotine dependence: Secondary | ICD-10-CM | POA: Insufficient documentation

## 2018-07-09 DIAGNOSIS — I4819 Other persistent atrial fibrillation: Secondary | ICD-10-CM | POA: Diagnosis present

## 2018-07-09 DIAGNOSIS — I484 Atypical atrial flutter: Secondary | ICD-10-CM | POA: Diagnosis not present

## 2018-07-09 DIAGNOSIS — I4892 Unspecified atrial flutter: Secondary | ICD-10-CM | POA: Diagnosis not present

## 2018-07-09 NOTE — Progress Notes (Signed)
Primary Care Physician: Dr. Virgina Jock Referring Physician: Dr. Evern Core is a 83 y.o. male that is in the afib clinic for new onset afib that was seen yesterday at time of cataract surgery. He was completely asymptomatic. In the office now for further evaluation. EKG continues to show afib at 115 bpm. He still feels well, denies any shortness of breath with exertion or dyspnea.Just completed Echo ordered by Dr. Virgina Jock. Results pending.  He denies high caffeine intake. Recommended not to exceed 2 drinks a week. Denies snoring/apnea. He was asked to hold anticoagulation until today as he had cataract surgery yesterday.   F/u in afib clinic, he remains in rate controlled afib, after adding BB. After one day on anticoagulation, he noted blood in urine. Went back to PCP and was found to have UTI and is on Cipro, finishes tomorrow. The blood resolved after 2 days on antibiotic's. He is pending the second cataract on Tuesday.  F/u afib clinic, 6/6. He had second cataract  surgery and was told he was in and out of rhythm while on the monitor. I have no strips to review. Pt still would not be eligible for DCCV until after 6/12, but  This will not be helpful if he is paroxysmal. EKG shows atrial flutter with variable  block today. He is still having issues with hematuria. After finishing first antibiotic, hematuria stopped but was present again in a few days. He is now on Cipro and bleeding stopped again on drug after a few days. PCP told pt that if bleeding reoccurs after finishing Cipro, he will send to urology. He is asymptomatic with arrhythmia.  Fu/ in afib clinic,7/17. Ekg shows controlled atrial flutter. He reports that he feels miserable . He states that he cannot sleep as to having to urinate all night. He thinks this is secondary to one of the meds for afib. He feels lightheaded and fatigued. He is still having hematuria and is pending a cystoscopy on Monday.  30 day monitor raw data is  reviewed, has not been read by MD as of yet. This does show that he is in SR for over 80% of the time, and afib/flutter less than 20% of the time. He has been very well rate controlled with most HR's around 80 bpm.  F/u in afib clinic, 7/25,after  monitor was read by cardiology and showed that pt was  persistently in afib/flutter( report was reading aflutter as SR) and he is back to be set up for cardioversion. On last visit, I stopped metoprolol as he felt the new meds(BB, DOAC) were making him feel terrible. He reported that he was not sleeping 2/2 to frequent urination and was tired. He was still having hematuria. 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 The bleeding has stopped. He is agreeable for cardioversion and has not missed any doses of eliquis 5 mg bid.  F/u in afib clinic, 11/19. He remains in asymptomatic afib. He feels well. He states on his own accord, cut his eliquis in half to 2.5 mg bid and the hematuria stopped. He did this 3 months ago. He has not re challenged the full dose.  She saw Dr. Rayann Heman back in August and it was decided that rate control was the way to proceed.  F/u in afib clinic, 5/26. He remains in  persistent afib and it is rate controlled . He had ongoing issues with hematuria but had a cysto in January 2020 and had 2 polyps removed. Since then he has had no further hematuria. Continues on eliquis.  He is still asymptomatic with  afib/flutter.  Today, he denies symptoms of palpitations, chest pain, shortness of breath, orthopnea, PND, lower extremity edema, dizziness, presyncope, syncope, or neurologic sequela.. The patient is tolerating medications without difficulties and is otherwise without complaint today.     Current Outpatient Medications  Medication Sig Dispense Refill  . apixaban (ELIQUIS) 5 MG TABS tablet Take 1 tablet (5 mg total) by  mouth 2 (two) times daily. 60 tablet 6  . cephALEXin (KEFLEX) 500 MG capsule Take 1 capsule (500 mg total) by mouth at bedtime. 7 capsule 0  . Cholecalciferol (VITAMIN D) 2000 units CAPS Take 2,000 Units by mouth daily.     Marland Kitchen doxycycline (ADOXA) 50 MG tablet Take 10 mg by mouth as needed. For rash on scalp    . finasteride (PROSCAR) 5 MG tablet Take 5 mg by mouth every morning.     . loratadine (CLARITIN) 10 MG tablet Take 10 mg by mouth daily.    . Misc Natural Products (PROSTATE HEALTH PO) Take 1 tablet by mouth daily.    . Multiple Vitamin (MULTIVITAMIN) tablet Take 1 tablet by mouth daily.    . Multiple Vitamins-Minerals (PRESERVISION AREDS 2 PO) Take 1 capsule by mouth 2 (two) times daily.      No current facility-administered medications for this encounter.     No Known Allergies  Social History   Socioeconomic History  . Marital status: Single    Spouse name: Not on file  . Number of children: Not on file  . Years of education: Not on file  . Highest education level: Not on file  Occupational History  . Not on file  Social Needs  . Financial resource strain: Not on file  . Food insecurity:    Worry: Not on file    Inability: Not on file  . Transportation needs:    Medical: Not on file    Non-medical: Not on file  Tobacco Use  . Smoking status: Former Smoker    Last attempt to quit: 1968    Years since quitting: 52.4  . Smokeless tobacco: Never Used  Substance and Sexual Activity  . Alcohol use: Yes    Comment: very occasional  . Drug use: Never  . Sexual activity: Not Currently  Lifestyle  . Physical activity:    Days per week: Not on file    Minutes per session: Not on file  . Stress: Not on file  Relationships  . Social connections:    Talks on phone: Not on file    Gets together: Not on file    Attends religious service: Not on file    Active member of club or organization: Not on file    Attends meetings of clubs or organizations: Not on file     Relationship status: Not on file  . Intimate partner violence:    Fear of current or ex partner: Not on file    Emotionally abused: Not on file    Physically abused: Not on file    Forced sexual activity: Not on file  Other Topics Concern  . Not on file  Social History Narrative  . Not on file    No family history on file.  ROS- All systems are reviewed and negative except  as per the HPI above  Physical Exam: Vitals:   07/09/18 1339  Weight: 92.3 kg  Height: 5\' 7"  (1.702 m)   Wt Readings from Last 3 Encounters:  07/09/18 92.3 kg  03/15/18 88 kg  03/15/18 88.1 kg    Labs: Lab Results  Component Value Date   NA 143 03/15/2018   K 4.1 03/15/2018   CL 110 09/06/2017   CO2 23 09/06/2017   GLUCOSE 120 (H) 03/15/2018   BUN 18 09/06/2017   CREATININE 0.99 09/06/2017   CALCIUM 8.8 (L) 09/06/2017   No results found for: INR No results found for: CHOL, HDL, LDLCALC, TRIG   GEN- The patient is well appearing, alert and oriented x 3 today.   Head- normocephalic, atraumatic Eyes-  Sclera clear, conjunctiva pink Ears- hearing intact Oropharynx- clear Neck- supple, no JVP Lymph- no cervical lymphadenopathy Lungs- Clear to ausculation bilaterally, normal work of breathing Heart- irregular rate and rhythm, no murmurs, rubs or gallops, PMI not laterally displaced GI- soft, NT, ND, + BS Extremities- no clubbing, cyanosis, or edema MS- no significant deformity or atrophy Skin- no rash or lesion Psych- euthymic mood, full affect Neuro- strength and sensation are intact  EKG-Atypical atrial  flutter at 72 bpm (4:1 av conduction ) qrs int 80 ms, qtc 422 ms Echo- Study Conclusions  - Left ventricle: The cavity size was normal. Wall thickness was   normal. Indeterminant diastolic function (atrial fibrillation).   The estimated ejection fraction was 50%. Diffuse hypokinesis. - Mitral valve: Mildly calcified annulus. There was no significant   regurgitation. - Left atrium:  The atrium was moderately dilated. - Right ventricle: The cavity size was normal. Systolic function   was normal. - Right atrium: The atrium was mildly dilated. - Tricuspid valve: Peak RV-RA gradient (S): 36 mm Hg. - Pulmonary arteries: PA peak pressure: 39 mm Hg (S). - Inferior vena cava: The vessel was normal in size. The   respirophasic diameter changes were in the normal range (>= 50%),   consistent with normal central venous pressure.  Impressions:  - The patient was in rapid atrial fibrillation. EF 50%, mild   diffuse hypokinesis. Normal RV size and systolic function. No   significant valvular abnormalities. Mild pulmonary hypertension.  30 day monitor -Study Highlights   1. Atrial fib or atrial fib/flutter with a CVR and RVR 2. No prolonged pauses 3. Noise artifact 4. No ventricular arrhythmias noted      Assessment and Plan: 1. Asymptomatic persistent afib/flutter He is  rate controlled without rate control drugs on board   Off metoprolol at pt's request for fatigue  Hematuria resolved with 2 polyps removed  Continue eliquis 5 mg bid    He will f/u here in 9 months Butch Penny C. Carroll, [facility] [address] [place], [place] [address] [phone]

## 2018-09-07 ENCOUNTER — Other Ambulatory Visit (HOSPITAL_COMMUNITY): Payer: Self-pay | Admitting: Nurse Practitioner

## 2018-11-08 ENCOUNTER — Telehealth (HOSPITAL_COMMUNITY): Payer: Self-pay | Admitting: *Deleted

## 2018-11-08 NOTE — Telephone Encounter (Signed)
Pt to have crown per [facility]. Per Roderic Palau NP patient may continue Eliquis without interruption during the procedure.

## 2019-03-08 ENCOUNTER — Ambulatory Visit: Payer: Medicare Other

## 2019-03-08 ENCOUNTER — Ambulatory Visit: Payer: Medicare Other | Attending: Internal Medicine

## 2019-03-08 DIAGNOSIS — Z23 Encounter for immunization: Secondary | ICD-10-CM | POA: Insufficient documentation

## 2019-03-08 NOTE — Progress Notes (Signed)
   Covid-19 Vaccination Clinic  Name:  Manuel Moreno    MRN: [id number] DOB: 1935/08/09  03/08/2019  Manuel Moreno was observed post Covid-19 immunization for 15 minutes without incidence. He was provided with Vaccine Information Sheet and instruction to access the V-Safe system.   Manuel Moreno was instructed to call 911 with any severe reactions post vaccine: Marland Kitchen Difficulty breathing  . Swelling of your face and throat  . A fast heartbeat  . A bad rash all over your body  . Dizziness and weakness    Immunizations Administered    Name Date Dose VIS Date Route   Pfizer COVID-19 Vaccine 03/08/2019 11:05 AM 0.3 mL 01/24/2019 Intramuscular   Manufacturer: [place]   Lot: [id number]   [place]: [id number]

## 2019-03-29 ENCOUNTER — Ambulatory Visit: Payer: Medicare Other | Attending: Internal Medicine

## 2019-03-29 DIAGNOSIS — Z23 Encounter for immunization: Secondary | ICD-10-CM

## 2019-03-29 NOTE — Progress Notes (Signed)
   Covid-19 Vaccination Clinic  Name:  Manuel Moreno    MRN: [id number] DOB: Apr 23, 1935  03/29/2019  [name] was observed post Covid-19 immunization for 15 minutes without incidence. He was provided with Vaccine Information Sheet and instruction to access the V-Safe system.   [name] was instructed to call 911 with any severe reactions post vaccine: Marland Kitchen Difficulty breathing  . Swelling of your face and throat  . A fast heartbeat  . A bad rash all over your body  . Dizziness and weakness    Immunizations Administered    Name Date Dose VIS Date Route   Pfizer COVID-19 Vaccine 03/29/2019  9:54 AM 0.3 mL 01/24/2019 Intramuscular   Manufacturer: [place]   Lot: [id number]   [place]: [id number]

## 2019-04-06 ENCOUNTER — Other Ambulatory Visit (HOSPITAL_COMMUNITY): Payer: Self-pay | Admitting: Nurse Practitioner

## 2019-04-30 ENCOUNTER — Other Ambulatory Visit (HOSPITAL_COMMUNITY): Payer: Self-pay | Admitting: Nurse Practitioner

## 2019-05-01 ENCOUNTER — Other Ambulatory Visit: Payer: Self-pay

## 2019-05-01 ENCOUNTER — Ambulatory Visit (HOSPITAL_COMMUNITY)
Admission: RE | Admit: 2019-05-01 | Discharge: 2019-05-01 | Disposition: A | Discharge status: Home / Self Care | Payer: Medicare Other | Source: Ambulatory Visit | Attending: Nurse Practitioner | Admitting: Nurse Practitioner

## 2019-05-01 VITALS — BP 136/70 | HR 97 | Ht 67.0 in | Wt 208.4 lb

## 2019-05-01 DIAGNOSIS — D6869 Other thrombophilia: Secondary | ICD-10-CM

## 2019-05-01 DIAGNOSIS — Z79899 Other long term (current) drug therapy: Secondary | ICD-10-CM | POA: Insufficient documentation

## 2019-05-01 DIAGNOSIS — Z7901 Long term (current) use of anticoagulants: Secondary | ICD-10-CM | POA: Diagnosis not present

## 2019-05-01 DIAGNOSIS — Z87891 Personal history of nicotine dependence: Secondary | ICD-10-CM | POA: Insufficient documentation

## 2019-05-01 DIAGNOSIS — I4892 Unspecified atrial flutter: Secondary | ICD-10-CM | POA: Diagnosis not present

## 2019-05-01 DIAGNOSIS — I484 Atypical atrial flutter: Secondary | ICD-10-CM | POA: Diagnosis not present

## 2019-05-01 DIAGNOSIS — I4819 Other persistent atrial fibrillation: Secondary | ICD-10-CM | POA: Insufficient documentation

## 2019-05-01 NOTE — Progress Notes (Addendum)
Primary Care Physician: Dr. 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 male that is in the afib clinic for new onset afib that was seen yesterday at time of cataract surgery. He was completely asymptomatic. In the office now for further evaluation. EKG continues to show afib at 115 bpm. He still feels well, denies any shortness of breath with exertion or dyspnea.Just completed Echo ordered by Dr. Virgina Jock. Results pending.  He denies high caffeine intake. Recommended not to exceed 2 drinks a week. Denies snoring/apnea. He was asked to hold anticoagulation until today as he had cataract surgery yesterday.   F/u in afib clinic, he remains in rate controlled afib, after adding BB. After one day on anticoagulation, he noted blood in urine. Went back to PCP and was found to have UTI and is on Cipro, finishes tomorrow. The blood resolved after 2 days on antibiotic's. He is pending the second cataract on Tuesday.  F/u afib clinic, 6/6. He had second cataract  surgery and was told he was in and out of rhythm while on the monitor. I have no strips to review. Pt still would not be eligible for DCCV until after 6/12, but  This will not be helpful if he is paroxysmal. EKG shows atrial flutter with variable  block today. He is still having issues with hematuria. After finishing first antibiotic, hematuria stopped but was present again in a few days. He is now on Cipro and bleeding stopped again on drug after a few days. PCP told pt that if bleeding reoccurs after finishing Cipro, he will send to urology. He is asymptomatic with arrhythmia.  Fu/ in afib clinic,7/17. Ekg shows controlled atrial flutter. He reports that he feels miserable . He states that he cannot sleep as to having to urinate all night. He thinks this is secondary to one of the meds for afib. He feels lightheaded and fatigued. He is still having hematuria and is pending a cystoscopy on Monday.  30 day monitor raw data is  reviewed, has not been read by MD as of yet. This does show that he is in SR for over 80% of the time, and afib/flutter less than 20% of the time. He has been very well rate controlled with most HR's around 80 bpm.  F/u in afib clinic, 7/25,after  monitor was read by cardiology and showed that pt was  persistently in afib/flutter( report was reading aflutter as SR) and he is back to be set up for cardioversion. On last visit, I stopped metoprolol as he felt the new meds(BB, DOAC) were making him feel terrible. He reported that he was not sleeping 2/2 to frequent urination and was tired. He was still having hematuria. 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 The bleeding has stopped. He is agreeable for cardioversion and has not missed any doses of eliquis 5 mg bid.  F/u in afib clinic, 11/19. He remains in asymptomatic afib. He feels well. He states on his own accord, cut his eliquis in half to 2.5 mg bid and the hematuria stopped. He did this 3 months ago. He has not re challenged the full dose.  She saw Dr. Rayann Heman back in August and it was decided that rate control was the way to proceed.  F/u in afib clinic, 5/26. He remains in  persistent afib and it is rate controlled . He had ongoing issues with hematuria but had a cysto in January 2020 and had 2 polyps removed. Since then he has had no further hematuria. Continues on eliquis.  He is still asymptomatic with  Afib/flutter.  F/u in afib clinic, 05/01/19. He is in permanent afib rate controlled.  He has gone back to swimming 5 days a week. Appears to tolerate afib well. No bleeding with eliquis 5 mg bid for a CHA2DS2VASc score of 2. Saw PCP recently with labs drawn. Will request copy.  Today, he denies symptoms of palpitations, chest pain, shortness of breath, orthopnea, PND, lower extremity edema, dizziness, presyncope, syncope, or  neurologic sequela.. The patient is tolerating medications without difficulties and is otherwise without complaint today.     Current Outpatient Medications  Medication Sig Dispense Refill  . apixaban (ELIQUIS) 5 MG TABS tablet Take 1 tablet (5 mg total) by mouth 2 (two) times daily. 60 tablet 2  . Cholecalciferol (VITAMIN D) 2000 units CAPS Take 2,000 Units by mouth daily.     Marland Kitchen doxycycline (ADOXA) 50 MG tablet Take 10 mg by mouth as needed. For rash on scalp    . finasteride (PROSCAR) 5 MG tablet Take 5 mg by mouth every morning.     Marland Kitchen lisinopril (ZESTRIL) 5 MG tablet Take 5 mg by mouth daily.    Marland Kitchen loratadine (CLARITIN) 10 MG tablet Take 10 mg by mouth daily.    . Multiple Vitamin (MULTI-VITAMIN) tablet Take by mouth daily.     . Multiple Vitamins-Minerals (PRESERVISION AREDS 2 PO) Take 1 capsule by mouth 2 (two) times daily.     . tamsulosin (FLOMAX) 0.4 MG CAPS capsule Take 0.4 mg by mouth daily after supper.    . triamcinolone cream (KENALOG) 0.1 % as needed.      No current facility-administered medications for this encounter.    No Known Allergies  Social History   Socioeconomic History  . Marital status: Single    Spouse name: Not on file  . Number of children: Not on file  . Years of education: Not on file  . Highest education level: Not on file  Occupational History  . Not on file  Tobacco Use  . Smoking status: Former Smoker    Quit date: 1968    Years since quitting: 53.2  . Smokeless tobacco: Never Used  Substance and Sexual Activity  . Alcohol use: Yes    Comment: very occasional  . Drug use: Never  . Sexual activity: Not Currently  Other Topics Concern  . Not on file  Social History Narrative  . Not on file   Social Determinants of Health   Financial Resource Strain:   . Difficulty of Paying Living Expenses:   Food Insecurity:   . Worried About Charity fundraiser in the Last Year:   . Arboriculturist in the Last Year:   Transportation Needs:   .  Film/video editor (Medical):   Marland Kitchen Lack of Transportation (Non-Medical):   Physical Activity:   . Days of Exercise per Week:   . Minutes of Exercise per Session:   Stress:   . Feeling of Stress :   Social Connections:   . Frequency of Communication with Friends and Family:   . Frequency of Social Gatherings with Friends and Family:   . Attends Religious Services:   . Active Member of Clubs or Organizations:   . Attends Archivist Meetings:   .  Marital Status:   Intimate Partner Violence:   . Fear of Current or Ex-Partner:   . Emotionally Abused:   Marland Kitchen Physically Abused:   . Sexually Abused:     No family history on file.  ROS- All systems are reviewed and negative except as per the HPI above  Physical Exam: Vitals:   05/01/19 1510  BP: 136/70  Pulse: 97  Weight: 94.5 kg  Height: 5\' 7"  (1.702 m)   Wt Readings from Last 3 Encounters:  05/01/19 94.5 kg  07/09/18 92.3 kg  03/15/18 88 kg    Labs: Lab Results  Component Value Date   NA 143 03/15/2018   K 4.1 03/15/2018   CL 110 09/06/2017   CO2 23 09/06/2017   GLUCOSE 120 (H) 03/15/2018   BUN 18 09/06/2017   CREATININE 0.99 09/06/2017   CALCIUM 8.8 (L) 09/06/2017   No results found for: INR No results found for: CHOL, HDL, LDLCALC, TRIG   GEN- The patient is well appearing, alert and oriented x 3 today.   Head- normocephalic, atraumatic Eyes-  Sclera clear, conjunctiva pink Ears- hearing intact Oropharynx- clear Neck- supple, no JVP Lymph- no cervical lymphadenopathy Lungs- Clear to ausculation bilaterally, normal work of breathing Heart- irregular rate and rhythm, no murmurs, rubs or gallops, PMI not laterally displaced GI- soft, NT, ND, + BS Extremities- no clubbing, cyanosis, or edema MS- no significant deformity or atrophy Skin- no rash or lesion Psych- euthymic mood, full affect Neuro- strength and sensation are intact  EKG-Atypical atrial  flutter at 97 bpm (4:1 av conduction ) qrs int  66 ms, qtc 439 ms Echo- Study Conclusions  - Left ventricle: The cavity size was normal. Wall thickness was   normal. Indeterminant diastolic function (atrial fibrillation).   The estimated ejection fraction was 50%. Diffuse hypokinesis. - Mitral valve: Mildly calcified annulus. There was no significant   regurgitation. - Left atrium: The atrium was moderately dilated. - Right ventricle: The cavity size was normal. Systolic function   was normal. - Right atrium: The atrium was mildly dilated. - Tricuspid valve: Peak RV-RA gradient (S): 36 mm Hg. - Pulmonary arteries: PA peak pressure: 39 mm Hg (S). - Inferior vena cava: The vessel was normal in size. The   respirophasic diameter changes were in the normal range (>= 50%),   consistent with normal central venous pressure.  Impressions:  - The patient was in rapid atrial fibrillation. EF 50%, mild   diffuse hypokinesis. Normal RV size and systolic function. No   significant valvular abnormalities. Mild pulmonary hypertension.  30 day monitor -Study Highlights   1. Atrial fib or atrial fib/flutter with a CVR and RVR 2. No prolonged pauses 3. Noise artifact 4. No ventricular arrhythmias noted      Assessment and Plan: 1. Asymptomatic persistent afib/flutter He is  rate controlled without rate control drugs on board   Off metoprolol at pt's request for fatigue  Hematuria resolved with 2 polyps removed  Continue eliquis 5 mg bid  Will request recent labs from PCP  He will f/u here in 9 months  Addendum: received labs from PCP from 03/2019 with creatinine at 1.0 and BUN of 22. He  remains on appropriate dose of eliquis 5 mg bid   Geroge Baseman. Iyana Topor, [facility] [address] [place], [place] [address] [phone]

## 2019-05-02 ENCOUNTER — Encounter (HOSPITAL_COMMUNITY): Payer: Self-pay | Admitting: Nurse Practitioner

## 2019-05-02 NOTE — Addendum Note (Signed)
Encounter addended by: Sherran Needs, NP on: 05/02/2019 9:10 AM  Actions taken: Clinical Note Signed

## 2019-07-21 ENCOUNTER — Other Ambulatory Visit (HOSPITAL_COMMUNITY): Payer: Self-pay | Admitting: Nurse Practitioner

## 2020-01-21 ENCOUNTER — Other Ambulatory Visit: Payer: Self-pay

## 2020-01-21 ENCOUNTER — Ambulatory Visit (HOSPITAL_COMMUNITY)
Admission: RE | Admit: 2020-01-21 | Discharge: 2020-01-21 | Disposition: A | Discharge status: Home / Self Care | Payer: Medicare Other | Source: Ambulatory Visit | Attending: Nurse Practitioner | Admitting: Nurse Practitioner

## 2020-01-21 ENCOUNTER — Encounter (HOSPITAL_COMMUNITY): Payer: Self-pay | Admitting: Nurse Practitioner

## 2020-01-21 VITALS — BP 140/90 | HR 71 | Ht 67.0 in | Wt 206.2 lb

## 2020-01-21 DIAGNOSIS — I484 Atypical atrial flutter: Secondary | ICD-10-CM | POA: Diagnosis not present

## 2020-01-21 DIAGNOSIS — Z7901 Long term (current) use of anticoagulants: Secondary | ICD-10-CM | POA: Insufficient documentation

## 2020-01-21 DIAGNOSIS — I4819 Other persistent atrial fibrillation: Secondary | ICD-10-CM | POA: Insufficient documentation

## 2020-01-21 DIAGNOSIS — Z87891 Personal history of nicotine dependence: Secondary | ICD-10-CM | POA: Insufficient documentation

## 2020-01-21 DIAGNOSIS — I4892 Unspecified atrial flutter: Secondary | ICD-10-CM | POA: Insufficient documentation

## 2020-01-21 DIAGNOSIS — D6869 Other thrombophilia: Secondary | ICD-10-CM | POA: Diagnosis not present

## 2020-01-21 NOTE — Progress Notes (Signed)
Primary Care Physician: Dr. 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 male that is in the afib clinic for new onset afib that was seen yesterday at time of cataract surgery. He was completely asymptomatic. In the office now for further evaluation. EKG continues to show afib at 115 bpm. He still feels well, denies any shortness of breath with exertion or dyspnea.Just completed Echo ordered by Dr. Virgina Jock. Results pending.  He denies high caffeine intake. Recommended not to exceed 2 drinks a week. Denies snoring/apnea. He was asked to hold anticoagulation until today as he had cataract surgery yesterday.   F/u in afib clinic, he remains in rate controlled afib, after adding BB. After one day on anticoagulation, he noted blood in urine. Went back to PCP and was found to have UTI and is on Cipro, finishes tomorrow. The blood resolved after 2 days on antibiotic's. He is pending the second cataract on Tuesday.  F/u afib clinic, 6/6. He had second cataract  surgery and was told he was in and out of rhythm while on the monitor. I have no strips to review. Pt still would not be eligible for DCCV until after 6/12, but  This will not be helpful if he is paroxysmal. EKG shows atrial flutter with variable  block today. He is still having issues with hematuria. After finishing first antibiotic, hematuria stopped but was present again in a few days. He is now on Cipro and bleeding stopped again on drug after a few days. PCP told pt that if bleeding reoccurs after finishing Cipro, he will send to urology. He is asymptomatic with arrhythmia.  Fu/ in afib clinic,7/17. Ekg shows controlled atrial flutter. He reports that he feels miserable . He states that he cannot sleep as to having to urinate all night. He thinks this is secondary to one of the meds for afib. He feels lightheaded and fatigued. He is still having hematuria and is pending a cystoscopy on Monday.  30 day monitor raw data is  reviewed, has not been read by MD as of yet. This does show that he is in SR for over 80% of the time, and afib/flutter less than 20% of the time. He has been very well rate controlled with most HR's around 80 bpm.  F/u in afib clinic, 7/25,after  monitor was read by cardiology and showed that pt was  persistently in afib/flutter( report was reading aflutter as SR) and he is back to be set up for cardioversion. On last visit, I stopped metoprolol as he felt the new meds(BB, DOAC) were making him feel terrible. He reported that he was not sleeping 2/2 to frequent urination and was tired. He was still having hematuria. 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 The bleeding has stopped. He is agreeable for cardioversion and has not missed any doses of eliquis 5 mg bid.  F/u in afib clinic, 11/19. He remains in asymptomatic afib. He feels well. He states on his own accord, cut his eliquis in half to 2.5 mg bid and the hematuria stopped. He did this 3 months ago. He has not re challenged the full dose.  She saw Dr. Rayann Heman back in August and it was decided that rate control was the way to proceed.  F/u in afib clinic, 5/26. He remains in  persistent afib and it is rate controlled . He had ongoing issues with hematuria but had a cysto in January 2020 and had 2 polyps removed. Since then he has had no further hematuria. Continues on eliquis.  He is still asymptomatic with  Afib/flutter.  F/u in afib clinic, 05/01/19. He is in permanent afib rate controlled.  He has gone back to swimming 5 days a week. Appears to tolerate afib well. No bleeding with eliquis 5 mg bid for a CHA2DS2VASc score of 2. Saw PCP recently with labs drawn. Will request copy.  F/u in afib clinic 01/22/20. He remains in permanent  rate controlled afib and is is asymptomatic. He describes being lightheaded when he stands form a  lying position to a standing position for a few minutes first thing in the am. Otherwise, no other lightheadedness. He was wondering if this was form eliquis and wanted to cut in half.  Explained it was due to position change, dangle for a few minutes on the side of the bed and to reduce DOAC would put him at risk of stroke.Eliquis  is  appropriately dosed at 5 mg bid with weight of 206 lbs, creatinine  1.0(last checked 03/2019), and age 74.   Today, he denies symptoms of palpitations, chest pain, shortness of breath, orthopnea, PND, lower extremity edema, dizziness, presyncope, syncope, or neurologic sequela.. The patient is tolerating medications without difficulties and is otherwise without complaint today.     Current Outpatient Medications  Medication Sig Dispense Refill  . Cholecalciferol (VITAMIN D) 2000 units CAPS Take 2,000 Units by mouth daily.     Marland Kitchen doxycycline (ADOXA) 50 MG tablet Take 10 mg by mouth as needed. For rash on scalp    . ELIQUIS 5 MG TABS tablet TAKE 1 TABLET BY MOUTH TWICE A DAY 60 tablet 6  . finasteride (PROSCAR) 5 MG tablet Take 5 mg by mouth every morning.     Marland Kitchen lisinopril (ZESTRIL) 5 MG tablet Take 5 mg by mouth daily.    Marland Kitchen loratadine (CLARITIN) 10 MG tablet Take 10 mg by mouth daily.    . Multiple Vitamin (MULTI-VITAMIN) tablet Take by mouth daily.     . Multiple Vitamins-Minerals (PRESERVISION AREDS 2 PO) Take 1 capsule by mouth 2 (two) times daily.     . tamsulosin (FLOMAX) 0.4 MG CAPS capsule Take 0.4 mg by mouth daily after supper.    . triamcinolone cream (KENALOG) 0.1 % as needed.      No current facility-administered medications for this encounter.    No Known Allergies  Social History   Socioeconomic History  . Marital status: Single    Spouse name: Not on file  . Number of children: Not on file  . Years of education: Not on file  . Highest education level: Not on file  Occupational History  . Not on file  Tobacco Use  . Smoking status: Former  Smoker    Quit date: 1968    Years since quitting: 53.9  . Smokeless tobacco: Never Used  Vaping Use  . Vaping Use: Never used  Substance and Sexual Activity  . Alcohol use: Yes    Comment: very occasional  . Drug use: Never  . Sexual activity: Not Currently  Other Topics Concern  . Not on file  Social History Narrative  . Not on file   Social Determinants of Health   Financial Resource Strain:   . Difficulty of Paying Living Expenses: Not on file  Food Insecurity:   .  Worried About Charity fundraiser in the Last Year: Not on file  . Ran Out of Food in the Last Year: Not on file  Transportation Needs:   . Lack of Transportation (Medical): Not on file  . Lack of Transportation (Non-Medical): Not on file  Physical Activity:   . Days of Exercise per Week: Not on file  . Minutes of Exercise per Session: Not on file  Stress:   . Feeling of Stress : Not on file  Social Connections:   . Frequency of Communication with Friends and Family: Not on file  . Frequency of Social Gatherings with Friends and Family: Not on file  . Attends Religious Services: Not on file  . Active Member of Clubs or Organizations: Not on file  . Attends Archivist Meetings: Not on file  . Marital Status: Not on file  Intimate Partner Violence:   . Fear of Current or Ex-Partner: Not on file  . Emotionally Abused: Not on file  . Physically Abused: Not on file  . Sexually Abused: Not on file    No family history on file.  ROS- All systems are reviewed and negative except as per the HPI above  Physical Exam: Vitals:   01/21/20 1005  BP: 140/90  Pulse: 71  Weight: 93.5 kg  Height: 5\' 7"  (1.702 m)   Wt Readings from Last 3 Encounters:  01/21/20 93.5 kg  05/01/19 94.5 kg  07/09/18 92.3 kg    Labs: Lab Results  Component Value Date   NA 143 03/15/2018   K 4.1 03/15/2018   CL 110 09/06/2017   CO2 23 09/06/2017   GLUCOSE 120 (H) 03/15/2018   BUN 18 09/06/2017   CREATININE 0.99  09/06/2017   CALCIUM 8.8 (L) 09/06/2017   No results found for: INR No results found for: CHOL, HDL, LDLCALC, TRIG   GEN- The patient is well appearing, alert and oriented x 3 today.   Head- normocephalic, atraumatic Eyes-  Sclera clear, conjunctiva pink Ears- hearing intact Oropharynx- clear Neck- supple, no JVP Lymph- no cervical lymphadenopathy Lungs- Clear to ausculation bilaterally, normal work of breathing Heart- regular rate and rhythm, no murmurs, rubs or gallops, PMI not laterally displaced GI- soft, NT, ND, + BS Extremities- no clubbing, cyanosis, or edema MS- no significant deformity or atrophy Skin- no rash or lesion Psych- euthymic mood, full affect Neuro- strength and sensation are intact  EKG-Atypical atrial  flutter at 71  bpm (4:1 av conduction ) qrs int 110 ms, qtc 423  ms Echo- Study Conclusions  - Left ventricle: The cavity size was normal. Wall thickness was   normal. Indeterminant diastolic function (atrial fibrillation).   The estimated ejection fraction was 50%. Diffuse hypokinesis. - Mitral valve: Mildly calcified annulus. There was no significant   regurgitation. - Left atrium: The atrium was moderately dilated. - Right ventricle: The cavity size was normal. Systolic function   was normal. - Right atrium: The atrium was mildly dilated. - Tricuspid valve: Peak RV-RA gradient (S): 36 mm Hg. - Pulmonary arteries: PA peak pressure: 39 mm Hg (S). - Inferior vena cava: The vessel was normal in size. The   respirophasic diameter changes were in the normal range (>= 50%),   consistent with normal central venous pressure.  Impressions:  - The patient was in rapid atrial fibrillation. EF 50%, mild   diffuse hypokinesis. Normal RV size and systolic function. No   significant valvular abnormalities. Mild pulmonary hypertension.  30 day monitor -Study  Highlights   1. Atrial fib or atrial fib/flutter with a CVR and RVR 2. No prolonged pauses 3. Noise  artifact 4. No ventricular arrhythmias noted      Assessment and Plan: 1. Asymptomatic persistent afib/flutter He is  rate controlled without rate control drugs on board   Off metoprolol at pt's request for fatigue  Continue eliquis 5 mg bid for a CHA2DS2VASc of 2.   He will f/u here in 9 months    Geroge Baseman. Alida Greiner, [facility] [address] [place], [place] [address] [phone]

## 2020-02-05 ENCOUNTER — Other Ambulatory Visit (HOSPITAL_COMMUNITY): Payer: Self-pay | Admitting: Nurse Practitioner

## 2020-12-01 ENCOUNTER — Other Ambulatory Visit: Payer: Self-pay

## 2020-12-01 ENCOUNTER — Ambulatory Visit (HOSPITAL_COMMUNITY)
Admission: RE | Admit: 2020-12-01 | Discharge: 2020-12-01 | Disposition: A | Discharge status: Home / Self Care | Payer: Medicare Other | Source: Ambulatory Visit | Attending: Nurse Practitioner | Admitting: Nurse Practitioner

## 2020-12-01 VITALS — BP 144/80 | HR 74 | Ht 67.0 in | Wt 207.4 lb

## 2020-12-01 DIAGNOSIS — R5383 Other fatigue: Secondary | ICD-10-CM | POA: Insufficient documentation

## 2020-12-01 DIAGNOSIS — D6869 Other thrombophilia: Secondary | ICD-10-CM | POA: Diagnosis not present

## 2020-12-01 DIAGNOSIS — Z7901 Long term (current) use of anticoagulants: Secondary | ICD-10-CM | POA: Diagnosis not present

## 2020-12-01 DIAGNOSIS — I4821 Permanent atrial fibrillation: Secondary | ICD-10-CM

## 2020-12-01 DIAGNOSIS — I4892 Unspecified atrial flutter: Secondary | ICD-10-CM | POA: Insufficient documentation

## 2020-12-01 DIAGNOSIS — I4819 Other persistent atrial fibrillation: Secondary | ICD-10-CM | POA: Insufficient documentation

## 2020-12-01 NOTE — Progress Notes (Signed)
Primary Care Physician: Dr. Virgina Jock Referring Physician: Dr. Evern Core is a 85 y.o. male that is in the afib clinic for new onset afib that was seen yesterday at time of cataract surgery. He was completely asymptomatic. In the office now for further evaluation. EKG continues to show afib at 115 bpm. He still feels well, denies any shortness of breath with exertion or dyspnea.Just completed Echo ordered by Dr. Virgina Jock. Results pending.  He denies high caffeine intake. Recommended not to exceed 2 drinks a week. Denies snoring/apnea. He was asked to hold anticoagulation until today as he had cataract surgery yesterday.   F/u in afib clinic, he remains in rate controlled afib, after adding BB. After one day on anticoagulation, he noted blood in urine. Went back to PCP and was found to have UTI and is on Cipro, finishes tomorrow. The blood resolved after 2 days on antibiotic's. He is pending the second cataract on Tuesday.  F/u afib clinic, 6/6. He had second cataract  surgery and was told he was in and out of rhythm while on the monitor. I have no strips to review. Pt still would not be eligible for DCCV until after 6/12, but  This will not be helpful if he is paroxysmal. EKG shows atrial flutter with variable  block today. He is still having issues with hematuria. After finishing first antibiotic, hematuria stopped but was present again in a few days. He is now on Cipro and bleeding stopped again on drug after a few days. PCP told pt that if bleeding reoccurs after finishing Cipro, he will send to urology. He is asymptomatic with arrhythmia.  Fu/ in afib clinic,7/17. Ekg shows controlled atrial flutter. He reports that he feels miserable . He states that he cannot sleep as to having to urinate all night. He thinks this is secondary to one of the meds for afib. He feels lightheaded and fatigued. He is still having hematuria and is pending a cystoscopy on Monday.  30 day monitor raw data is  reviewed, has not been read by MD as of yet. This does show that he is in SR for over 80% of the time, and afib/flutter less than 20% of the time. He has been very well rate controlled with most HR's around 80 bpm.  F/u in afib clinic, 7/25,after  monitor was read by cardiology and showed that pt was  persistently in afib/flutter( report was reading aflutter as SR) and he is back to be set up for cardioversion. On last visit, I stopped metoprolol as he felt the new meds(BB, DOAC) were making him feel terrible. He reported that he was not sleeping 2/2 to frequent urination and was tired. He was still having hematuria. 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 The bleeding has stopped. He is agreeable for cardioversion and has not missed any doses of eliquis 5 mg bid.  F/u in afib clinic, 11/19. He remains in asymptomatic afib. He feels well. He states on his own accord, cut his eliquis in half to 2.5 mg bid and the hematuria stopped. He did this 3 months ago. He has not re challenged the full dose.  She saw Dr. Rayann Heman back in August and it was decided that rate control was the way to proceed.  F/u in afib clinic, 5/26. He remains in  persistent afib and it is rate controlled . He had ongoing issues with hematuria but had a cysto in January 2020 and had 2 polyps removed. Since then he has had no further hematuria. Continues on eliquis.  He is still asymptomatic with  Afib/flutter.  F/u in afib clinic, 05/01/19. He is in permanent afib rate controlled.  He has gone back to swimming 5 days a week. Appears to tolerate afib well. No bleeding with eliquis 5 mg bid for a CHA2DS2VASc score of 2. Saw PCP recently with labs drawn. Will request copy.  F/u in afib clinic 01/22/20. He remains in permanent  rate controlled afib and is is asymptomatic. He describes being lightheaded when he stands form a  lying position to a standing position for a few minutes first thing in the am. Otherwise, no other lightheadedness. He was wondering if this was form eliquis and wanted to cut in half.  Explained it was due to position change, dangle for a few minutes on the side of the bed and to reduce DOAC would put him at risk of stroke.Eliquis  is  appropriately dosed at 5 mg bid with weight of 206 lbs, creatinine  1.0 (last checked 03/2019), and age 19.   F/u in afib clinic, 12/01/20, he lives in rate controlled permanent afib. He has occasional lightheadedness which is chronic, no falls, otherwise feels unchanged. No bleeding issues. Had labs recently with PCP and I will call and get results.   Today, he denies symptoms of palpitations, chest pain, shortness of breath, orthopnea, PND, lower extremity edema, dizziness, presyncope, syncope, or neurologic sequela.. The patient is tolerating medications without difficulties and is otherwise without complaint today.     Current Outpatient Medications  Medication Sig Dispense Refill   Cholecalciferol (VITAMIN D) 2000 units CAPS Take 2,000 Units by mouth daily.      doxycycline (ADOXA) 50 MG tablet Take 10 mg by mouth as needed. For rash on scalp     ELIQUIS 5 MG TABS tablet TAKE 1 TABLET BY MOUTH TWICE A DAY 60 tablet 11   finasteride (PROSCAR) 5 MG tablet Take 5 mg by mouth every morning.      lisinopril (ZESTRIL) 5 MG tablet Take 5 mg by mouth daily.     loratadine (CLARITIN) 10 MG tablet Take 10 mg by mouth daily.     Multiple Vitamin (MULTI-VITAMIN) tablet Take by mouth daily.      Multiple Vitamins-Minerals (PRESERVISION AREDS 2 PO) Take 1 capsule by mouth 2 (two) times daily.      tamsulosin (FLOMAX) 0.4 MG CAPS capsule Take 0.4 mg by mouth daily after supper.     triamcinolone cream (KENALOG) 0.1 % as needed.      No current facility-administered medications for this encounter.    No Known Allergies  Social History   Socioeconomic History    Marital status: Single    Spouse name: Not on file   Number of children: Not on file   Years of education: Not on file   Highest education level: Not on file  Occupational History   Not on file  Tobacco Use   Smoking status: Former    Types: Cigarettes    Quit date: 1968    Years since quitting: 54.8   Smokeless tobacco: Never  Vaping Use   Vaping Use: Never used  Substance and Sexual Activity   Alcohol use: Yes    Comment: very occasional   Drug use: Never   Sexual activity: Not Currently  Other Topics Concern   Not on file  Social History Narrative   Not on file   Social Determinants of Health   Financial Resource Strain: Not on file  Food Insecurity: Not on file  Transportation Needs: Not on file  Physical Activity: Not on file  Stress: Not on file  Social Connections: Not on file  Intimate Partner Violence: Not on file    No family history on file.  ROS- All systems are reviewed and negative except as per the HPI above  Physical Exam: Vitals:   12/01/20 1432  BP: (!) 144/80  Pulse: 74  Weight: 94.1 kg  Height: 5\' 7"  (1.702 m)   Wt Readings from Last 3 Encounters:  12/01/20 94.1 kg  01/21/20 93.5 kg  05/01/19 94.5 kg    Labs: Lab Results  Component Value Date   NA 143 03/15/2018   K 4.1 03/15/2018   CL 110 09/06/2017   CO2 23 09/06/2017   GLUCOSE 120 (H) 03/15/2018   BUN 18 09/06/2017   CREATININE 0.99 09/06/2017   CALCIUM 8.8 (L) 09/06/2017   No results found for: INR No results found for: CHOL, HDL, LDLCALC, TRIG   GEN- The patient is well appearing, alert and oriented x 3 today.   Head- normocephalic, atraumatic Eyes-  Sclera clear, conjunctiva pink Ears- hearing intact Oropharynx- clear Neck- supple, no JVP Lymph- no cervical lymphadenopathy Lungs- Clear to ausculation bilaterally, normal work of breathing Heart- irregular rate and rhythm, no murmurs, rubs or gallops, PMI not laterally displaced GI- soft, NT, ND, + BS Extremities-  no clubbing, cyanosis, or edema MS- no significant deformity or atrophy Skin- no rash or lesion Psych- euthymic mood, full affect Neuro- strength and sensation are intact  EKG- afib at 74 bpm, qrs int 72 ms, qtc 399 ms  - Left ventricle: The cavity size was normal. Wall thickness was   normal. Indeterminant diastolic function (atrial fibrillation).   The estimated ejection fraction was 50%. Diffuse hypokinesis. - Mitral valve: Mildly calcified annulus. There was no significant   regurgitation. - Left atrium: The atrium was moderately dilated. - Right ventricle: The cavity size was normal. Systolic function   was normal. - Right atrium: The atrium was mildly dilated. - Tricuspid valve: Peak RV-RA gradient (S): 36 mm Hg. - Pulmonary arteries: PA peak pressure: 39 mm Hg (S). - Inferior vena cava: The vessel was normal in size. The   respirophasic diameter changes were in the normal range (>= 50%),   consistent with normal central venous pressure.   Impressions:   - The patient was in rapid atrial fibrillation. EF 50%, mild   diffuse hypokinesis. Normal RV size and systolic function. No   significant valvular abnormalities. Mild pulmonary hypertension.  30 day monitor -Study Highlights   1. Atrial fib or atrial fib/flutter with a CVR and RVR 2. No prolonged pauses 3. Noise artifact 4. No ventricular arrhythmias noted       Assessment and Plan: 1. Asymptomatic persistent afib/flutter He is  rate controlled without rate control drugs on board   Off metoprolol at pt's request for fatigue  Continue eliquis 5 mg bid for a CHA2DS2VASc of 2.  Will call for labs from PCP for bmet/cbc   He will f/u here in 1 year   Butch Penny C. Shemekia Patane, [facility] [address] [place], [place] [address] [phone]

## 2021-01-31 ENCOUNTER — Other Ambulatory Visit (HOSPITAL_COMMUNITY): Payer: Self-pay | Admitting: Nurse Practitioner

## 2021-12-27 ENCOUNTER — Ambulatory Visit (HOSPITAL_COMMUNITY)
Admission: RE | Admit: 2021-12-27 | Discharge: 2021-12-27 | Disposition: A | Discharge status: Home / Self Care | Payer: Medicare Other | Source: Ambulatory Visit | Attending: Nurse Practitioner | Admitting: Nurse Practitioner

## 2021-12-27 VITALS — BP 140/74 | HR 92 | Ht 67.0 in | Wt 195.4 lb

## 2021-12-27 DIAGNOSIS — I4892 Unspecified atrial flutter: Secondary | ICD-10-CM | POA: Insufficient documentation

## 2021-12-27 DIAGNOSIS — I272 Pulmonary hypertension, unspecified: Secondary | ICD-10-CM | POA: Diagnosis not present

## 2021-12-27 DIAGNOSIS — R5383 Other fatigue: Secondary | ICD-10-CM | POA: Diagnosis not present

## 2021-12-27 DIAGNOSIS — I4821 Permanent atrial fibrillation: Secondary | ICD-10-CM | POA: Insufficient documentation

## 2021-12-27 DIAGNOSIS — Z7901 Long term (current) use of anticoagulants: Secondary | ICD-10-CM | POA: Insufficient documentation

## 2021-12-27 DIAGNOSIS — I483 Typical atrial flutter: Secondary | ICD-10-CM

## 2021-12-27 DIAGNOSIS — Z79899 Other long term (current) drug therapy: Secondary | ICD-10-CM | POA: Insufficient documentation

## 2021-12-27 MED ORDER — APIXABAN 5 MG PO TABS: 5.0000 mg | ORAL_TABLET | Freq: Two times a day (BID) | ORAL | 11 refills | Status: DC

## 2021-12-27 NOTE — Progress Notes (Addendum)
Primary Care Physician: Dr. Virgina Jock Referring Physician: Dr. Virgina Jock Prior EP: Dr. Larey Moreno is a 86 y.o. male that is in the afib clinic for new onset afib that was seen yesterday at time of cataract surgery. He was completely asymptomatic. In the office now for further evaluation. EKG continues to show afib at 115 bpm. He still feels well, denies any shortness of breath with exertion or dyspnea.Just completed Echo ordered by Dr. Virgina Jock. Results pending.  He denies high caffeine intake. Recommended not to exceed 2 drinks a week. Denies snoring/apnea. He was asked to hold anticoagulation until today as he had cataract surgery yesterday.   F/u in afib clinic, he remains in rate controlled afib, after adding BB. After one day on anticoagulation, he noted blood in urine. Went back to PCP and was found to have UTI and is on Cipro, finishes tomorrow. The blood resolved after 2 days on antibiotic's. He is pending the second cataract on Tuesday.  F/u afib clinic, 6/6. He had second cataract  surgery and was told he was in and out of rhythm while on the monitor. I have no strips to review. Pt still would not be eligible for DCCV until after 6/12, but  This will not be helpful if he is paroxysmal. EKG shows atrial flutter with variable  block today. He is still having issues with hematuria. After finishing first antibiotic, hematuria stopped but was present again in a few days. He is now on Cipro and bleeding stopped again on drug after a few days. PCP told pt that if bleeding reoccurs after finishing Cipro, he will send to urology. He is asymptomatic with arrhythmia.  Fu/ in afib clinic,7/17. Ekg shows controlled atrial flutter. He reports that he feels miserable . He states that he cannot sleep as to having to urinate all night. He thinks this is secondary to one of the meds for afib. He feels lightheaded and fatigued. He is still having hematuria and is pending a cystoscopy on Monday.  30 day  monitor raw data is reviewed, has not been read by MD as of yet. This does show that he is in SR for over 80% of the time, and afib/flutter less than 20% of the time. He has been very well rate controlled with most HR's around 80 bpm.  F/u in afib clinic, 7/25,after  monitor was read by cardiology and showed that pt was  persistently in afib/flutter( report was reading aflutter as SR) and he is back to be set up for cardioversion. On last visit, I stopped metoprolol as he felt the new meds(BB, DOAC) were making him feel terrible. He reported that he was not sleeping 2/2 to frequent urination and was tired. He was still having hematuria. 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 The bleeding has stopped. He is agreeable for cardioversion and has not missed any doses of eliquis 5 mg bid.  F/u in afib clinic, 11/19. He remains in asymptomatic afib. He feels well. He states on his own accord, cut his eliquis in half to 2.5 mg bid and the hematuria stopped. He did this 3 months ago. He has not re challenged the full dose.  She saw Dr. Rayann Heman back in August and it was decided that rate control was the way to proceed.  F/u in afib clinic,  5/26. He remains in persistent afib and it is rate controlled . He had ongoing issues with hematuria but had a cysto in January 2020 and had 2 polyps removed. Since then he has had no further hematuria. Continues on eliquis.  He is still asymptomatic with  Afib/flutter.  F/u in afib clinic, 05/01/19. He is in permanent afib rate controlled.  He has gone back to swimming 5 days a week. Appears to tolerate afib well. No bleeding with eliquis 5 mg bid for a CHA2DS2VASc score of 2. Saw PCP recently with labs drawn. Will request copy.  F/u in afib clinic 01/22/20. He remains in permanent  rate controlled afib and is is asymptomatic. He describes being lightheaded  when he stands form a lying position to a standing position for a few minutes first thing in the am. Otherwise, no other lightheadedness. He was wondering if this was form eliquis and wanted to cut in half.  Explained it was due to position change, dangle for a few minutes on the side of the bed and to reduce DOAC would put him at risk of stroke.Eliquis  is  appropriately dosed at 5 mg bid with weight of 206 lbs, creatinine  1.0 (last checked 03/2019), and age 58.   F/u in afib clinic, 12/01/20, he lives in rate controlled permanent afib. He has occasional lightheadedness which is chronic, no falls, otherwise feels unchanged. No bleeding issues. Had labs recently with PCP and I will call and get results.   Here for one year f/u for permanent afib, 12/27/21. He remains rate controlled. He just sold his house and moved into [facility] in a matter of weeks. Because he has been so busy he has lost 10 lbs. He is feeling situated now and is enjoying his new home. He is not symptomatic with afib.   Today, he denies symptoms of palpitations, chest pain, shortness of breath, orthopnea, PND, lower extremity edema, dizziness, presyncope, syncope, or neurologic sequela.. The patient is tolerating medications without difficulties and is otherwise without complaint today.     Current Outpatient Medications  Medication Sig Dispense Refill   Cholecalciferol (VITAMIN D) 2000 units CAPS Take 2,000 Units by mouth daily.      doxycycline (ADOXA) 50 MG tablet Take 10 mg by mouth as needed. For rash on scalp     ELIQUIS 5 MG TABS tablet TAKE 1 TABLET BY MOUTH TWICE A DAY 60 tablet 11   finasteride (PROSCAR) 5 MG tablet Take 5 mg by mouth every morning.      lisinopril (ZESTRIL) 5 MG tablet Take 5 mg by mouth daily.     loratadine (CLARITIN) 10 MG tablet Take 10 mg by mouth daily.     Multiple Vitamin (MULTI-VITAMIN) tablet Take by mouth daily.      Multiple Vitamins-Minerals (PRESERVISION AREDS 2 PO) Take 1 capsule  by mouth 2 (two) times daily.      tamsulosin (FLOMAX) 0.4 MG CAPS capsule Take 0.4 mg by mouth daily after supper.     triamcinolone cream (KENALOG) 0.1 % as needed.      No current facility-administered medications for this encounter.    No Known Allergies  Social History   Socioeconomic History   Marital status: Single    Spouse name: Not on file   Number of children: Not on file   Years of education: Not on file   Highest education level: Not on file  Occupational History   Not on file  Tobacco Use  Smoking status: Former    Types: Cigarettes    Quit date: 1968    Years since quitting: 55.9   Smokeless tobacco: Never  Vaping Use   Vaping Use: Never used  Substance and Sexual Activity   Alcohol use: Yes    Comment: very occasional   Drug use: Never   Sexual activity: Not Currently  Other Topics Concern   Not on file  Social History Narrative   Not on file   Social Determinants of Health   Financial Resource Strain: Not on file  Food Insecurity: Not on file  Transportation Needs: Not on file  Physical Activity: Not on file  Stress: Not on file  Social Connections: Not on file  Intimate Partner Violence: Not on file    No family history on file.  ROS- All systems are reviewed and negative except as per the HPI above  Physical Exam: Vitals:   12/27/21 1407  BP: (!) 140/74  Pulse: 92  Weight: 88.6 kg  Height: '5\' 7"'$  (1.702 m)   Wt Readings from Last 3 Encounters:  12/27/21 88.6 kg  12/01/20 94.1 kg  01/21/20 93.5 kg    Labs: Lab Results  Component Value Date   NA 143 03/15/2018   K 4.1 03/15/2018   CL 110 09/06/2017   CO2 23 09/06/2017   GLUCOSE 120 (H) 03/15/2018   BUN 18 09/06/2017   CREATININE 0.99 09/06/2017   CALCIUM 8.8 (L) 09/06/2017   No results found for: "INR" No results found for: "CHOL", "HDL", "Garrett", "TRIG"   GEN- The patient is well appearing, alert and oriented x 3 today.   Head- normocephalic, atraumatic Eyes-   Sclera clear, conjunctiva pink Ears- hearing intact Oropharynx- clear Neck- supple, no JVP Lymph- no cervical lymphadenopathy Lungs- Clear to ausculation bilaterally, normal work of breathing Heart- irregular rate and rhythm, no murmurs, rubs or gallops, PMI not laterally displaced GI- soft, NT, ND, + BS Extremities- no clubbing, cyanosis, or edema MS- no significant deformity or atrophy Skin- no rash or lesion Psych- euthymic mood, full affect Neuro- strength and sensation are intact  EKG- Vent. rate 92 BPM PR interval * ms QRS duration 70 ms QT/QTcB 348/430 ms P-R-T axes * 58 63 Atrial fibrillation with premature ventricular or aberrantly conducted complexes Abnormal ECG When compared with ECG of 01-Dec-2020 14:38, PREVIOUS ECG IS PRESENT  - Left ventricle: The cavity size was normal. Wall thickness was   normal. Indeterminant diastolic function (atrial fibrillation).   The estimated ejection fraction was 50%. Diffuse hypokinesis. - Mitral valve: Mildly calcified annulus. There was no significant   regurgitation. - Left atrium: The atrium was moderately dilated. - Right ventricle: The cavity size was normal. Systolic function   was normal. - Right atrium: The atrium was mildly dilated. - Tricuspid valve: Peak RV-RA gradient (S): 36 mm Hg. - Pulmonary arteries: PA peak pressure: 39 mm Hg (S). - Inferior vena cava: The vessel was normal in size. The   respirophasic diameter changes were in the normal range (>= 50%),   consistent with normal central venous pressure.   Impressions:   - The patient was in rapid atrial fibrillation. EF 50%, mild   diffuse hypokinesis. Normal RV size and systolic function. No   significant valvular abnormalities. Mild pulmonary hypertension.  30 day monitor -Study Highlights   1. Atrial fib or atrial fib/flutter with a CVR and RVR 2. No prolonged pauses 3. Noise artifact 4. No ventricular arrhythmias noted  Assessment and  Plan: 1. Asymptomatic permanent afib/flutter He is  rate controlled without rate control drugs on board   Off metoprolol at pt's request for fatigue  Continue eliquis 5 mg bid for a CHA2DS2VASc of 2.  He states that he had blood work with PCP 2-3 months ago. Will request from  PCP.   He will f/u here in 1 year   Butch Penny C. Daryle Boyington, [facility] [address] [place], [place] [address] [phone]

## 2021-12-28 NOTE — Addendum Note (Signed)
Encounter addended by: Sherran Needs, NP on: 12/28/2021 12:04 PM  Actions taken: Clinical Note Signed

## 2022-03-17 ENCOUNTER — Other Ambulatory Visit (HOSPITAL_COMMUNITY): Payer: Self-pay

## 2022-03-17 MED ORDER — APIXABAN 5 MG PO TABS: 5.0000 mg | ORAL_TABLET | Freq: Two times a day (BID) | ORAL | 0 refills | Status: DC

## 2022-03-23 ENCOUNTER — Encounter (HOSPITAL_COMMUNITY): Payer: Self-pay | Admitting: *Deleted

## 2022-12-27 ENCOUNTER — Ambulatory Visit (HOSPITAL_COMMUNITY)
Admission: RE | Admit: 2022-12-27 | Discharge: 2022-12-27 | Disposition: A | Discharge status: Home / Self Care | Payer: Medicare Other | Source: Ambulatory Visit | Attending: Physician Assistant | Admitting: Physician Assistant

## 2022-12-27 ENCOUNTER — Encounter (HOSPITAL_COMMUNITY): Payer: Self-pay | Admitting: Physician Assistant

## 2022-12-27 VITALS — BP 142/80 | HR 82 | Ht 67.0 in | Wt 201.0 lb

## 2022-12-27 DIAGNOSIS — D6869 Other thrombophilia: Secondary | ICD-10-CM | POA: Insufficient documentation

## 2022-12-27 DIAGNOSIS — Z7901 Long term (current) use of anticoagulants: Secondary | ICD-10-CM | POA: Insufficient documentation

## 2022-12-27 DIAGNOSIS — I4811 Longstanding persistent atrial fibrillation: Secondary | ICD-10-CM | POA: Diagnosis present

## 2022-12-27 DIAGNOSIS — I4821 Permanent atrial fibrillation: Secondary | ICD-10-CM | POA: Diagnosis not present

## 2022-12-27 DIAGNOSIS — R9431 Abnormal electrocardiogram [ECG] [EKG]: Secondary | ICD-10-CM | POA: Insufficient documentation

## 2022-12-27 LAB — BASIC METABOLIC PANEL
Anion gap: 6 (ref 5–15)
BUN: 20 mg/dL (ref 8–23)
CO2: 23 mmol/L (ref 22–32)
Calcium: 9 mg/dL (ref 8.9–10.3)
Chloride: 109 mmol/L (ref 98–111)
Creatinine, Ser: 1.1 mg/dL (ref 0.61–1.24)
GFR, Estimated: >60 mL/min (ref >60)
Glucose, Bld: 95 mg/dL (ref 70–99)
Potassium: 4.7 mmol/L (ref 3.5–5.1)
Sodium: 138 mmol/L (ref 135–145)

## 2022-12-27 LAB — CBC
HCT: 41.4 % (ref 39.0–52.0)
Hemoglobin: 13.3 g/dL (ref 13.0–17.0)
MCH: 31.8 pg (ref 26.0–34.0)
MCHC: 32.1 g/dL (ref 30.0–36.0)
MCV: 99 fL (ref 80.0–100.0)
Platelets: 190 10*3/uL (ref 150–400)
RBC: 4.18 MIL/uL — ABNORMAL LOW (ref 4.22–5.81)
RDW: 13.2 % (ref 11.5–15.5)
WBC: 6.4 10*3/uL (ref 4.0–10.5)
nRBC: 0 % (ref 0.0–0.2)

## 2022-12-27 MED ORDER — APIXABAN 5 MG PO TABS: 5.0000 mg | ORAL_TABLET | Freq: Two times a day (BID) | ORAL | 3 refills | Status: DC

## 2022-12-27 NOTE — Progress Notes (Signed)
Primary Care Physician: Dr. Timothy Lasso Referring Physician: Dr. Timothy Lasso Prior EP: Dr. Darci Needle is a 87 y.o. male that is in the afib clinic for new onset afib that was seen yesterday at time of cataract surgery. He was completely asymptomatic. In the office now for further evaluation. EKG continues to show afib at 115 bpm. He still feels well, denies any shortness of breath with exertion or dyspnea.Just completed Echo ordered by Dr. Timothy Lasso. Results pending.  He denies high caffeine intake. Recommended not to exceed 2 drinks a week. Denies snoring/apnea. He was asked to hold anticoagulation until today as he had cataract surgery yesterday.   F/u in afib clinic, he remains in rate controlled afib, after adding BB. After one day on anticoagulation, he noted blood in urine. Went back to PCP and was found to have UTI and is on Cipro, finishes tomorrow. The blood resolved after 2 days on antibiotic's. He is pending the second cataract on Tuesday.  F/u afib clinic, 6/6. He had second cataract  surgery and was told he was in and out of rhythm while on the monitor. I have no strips to review. Pt still would not be eligible for DCCV until after 6/12, but  This will not be helpful if he is paroxysmal. EKG shows atrial flutter with variable  block today. He is still having issues with hematuria. After finishing first antibiotic, hematuria stopped but was present again in a few days. He is now on Cipro and bleeding stopped again on drug after a few days. PCP told pt that if bleeding reoccurs after finishing Cipro, he will send to urology. He is asymptomatic with arrhythmia.  Fu/ in afib clinic,7/17. Ekg shows controlled atrial flutter. He reports that he feels miserable . He states that he cannot sleep as to having to urinate all night. He thinks this is secondary to one of the meds for afib. He feels lightheaded and fatigued. He is still having hematuria and is pending a cystoscopy on Monday.  30 day  monitor raw data is reviewed, has not been read by MD as of yet. This does show that he is in SR for over 80% of the time, and afib/flutter less than 20% of the time. He has been very well rate controlled with most HR's around 80 bpm.  F/u in afib clinic, 7/25,after  monitor was read by cardiology and showed that pt was  persistently in afib/flutter( report was reading aflutter as SR) and he is back to be set up for cardioversion. On last visit, I stopped metoprolol as he felt the new meds(BB, DOAC) were making him feel terrible. He reported that he was not sleeping 2/2 to frequent urination and was tired. He was still having hematuria. 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 The bleeding has stopped. He is agreeable for cardioversion and has not missed any doses of eliquis 5 mg bid.  F/u in afib clinic, 11/19. He remains in asymptomatic afib. He feels well. He states on his own accord, cut his eliquis in half to 2.5 mg bid and the hematuria stopped. He did this 3 months ago. He has not re challenged the full dose.  She saw Dr. Johney Frame back in August and it was decided that rate control was the way to proceed.  F/u in afib clinic,  5/26. He remains in persistent afib and it is rate controlled . He had ongoing issues with hematuria but had a cysto in January 2020 and had 2 polyps removed. Since then he has had no further hematuria. Continues on eliquis.  He is still asymptomatic with  Afib/flutter.  F/u in afib clinic, 05/01/19. He is in permanent afib rate controlled.  He has gone back to swimming 5 days a week. Appears to tolerate afib well. No bleeding with eliquis 5 mg bid for a CHA2DS2VASc score of 2. Saw PCP recently with labs drawn. Will request copy.  F/u in afib clinic 01/22/20. He remains in permanent  rate controlled afib and is is asymptomatic. He describes being lightheaded  when he stands form a lying position to a standing position for a few minutes first thing in the am. Otherwise, no other lightheadedness. He was wondering if this was form eliquis and wanted to cut in half.  Explained it was due to position change, dangle for a few minutes on the side of the bed and to reduce DOAC would put him at risk of stroke.Eliquis  is  appropriately dosed at 5 mg bid with weight of 206 lbs, creatinine  1.0 (last checked 03/2019), and age 58.   F/u in afib clinic, 12/01/20, he lives in rate controlled permanent afib. He has occasional lightheadedness which is chronic, no falls, otherwise feels unchanged. No bleeding issues. Had labs recently with PCP and I will call and get results.   Here for one year f/u for permanent afib, 12/27/21. He remains rate controlled. He just sold his house and moved into [facility] in a matter of weeks. Because he has been so busy he has lost 10 lbs. He is feeling situated now and is enjoying his new home. He is not symptomatic with afib.   Follow up in the AF clinic 12/27/22. Patient remains in rate controlled afib. He is asymptomatic and unaware of his arrhythmia. No bleeding issues on anticoagulation.   Today, he denies symptoms of palpitations, chest pain, shortness of breath, orthopnea, PND, lower extremity edema, dizziness, presyncope, syncope, or neurologic sequela.. The patient is tolerating medications without difficulties and is otherwise without complaint today.     Current Outpatient Medications  Medication Sig Dispense Refill   Cholecalciferol (VITAMIN D) 2000 units CAPS Take 2,000 Units by mouth daily.      doxycycline (ADOXA) 50 MG tablet Take 10 mg by mouth as needed. For rash on scalp     finasteride (PROSCAR) 5 MG tablet Take 5 mg by mouth every morning.      lisinopril (ZESTRIL) 5 MG tablet Take 5 mg by mouth daily.     loratadine (CLARITIN) 10 MG tablet Take 10 mg by mouth daily.     Multiple Vitamin (MULTI-VITAMIN) tablet  Take by mouth daily.      Multiple Vitamins-Minerals (PRESERVISION AREDS 2 PO) Take 1 capsule by mouth 2 (two) times daily.      tamsulosin (FLOMAX) 0.4 MG CAPS capsule Take 0.4 mg by mouth daily after supper.     triamcinolone cream (KENALOG) 0.1 % as needed.      apixaban (ELIQUIS) 5 MG TABS tablet Take 1 tablet (5 mg total) by mouth 2 (two) times daily. 180 tablet 3   No current facility-administered medications for this encounter.    ROS- All systems are reviewed and negative except as per the HPI above  Physical Exam: Vitals:   12/27/22 1436  BP: (!) 142/80  Pulse:  82  Weight: 91.2 kg  Height: 5\' 7"  (1.702 m)    Wt Readings from Last 3 Encounters:  12/27/22 91.2 kg  12/27/21 88.6 kg  12/01/20 94.1 kg     GEN: Well nourished, well developed in no acute distress NECK: No JVD; No carotid bruits CARDIAC: Irregularly irregular rate and rhythm, no murmurs, rubs, gallops RESPIRATORY:  Clear to auscultation without rales, wheezing or rhonchi  ABDOMEN: Soft, non-tender, non-distended EXTREMITIES:  No edema; No deformity    EKG today demonstrates Afib Vent. rate 82 BPM PR interval * ms QRS duration 74 ms QT/QTcB 366/427 ms   07/04/17 - Left ventricle: The cavity size was normal. Wall thickness was   normal. Indeterminant diastolic function (atrial fibrillation).   The estimated ejection fraction was 50%. Diffuse hypokinesis. - Mitral valve: Mildly calcified annulus. There was no significant   regurgitation. - Left atrium: The atrium was moderately dilated. - Right ventricle: The cavity size was normal. Systolic function   was normal. - Right atrium: The atrium was mildly dilated. - Tricuspid valve: Peak RV-RA gradient (S): 36 mm Hg. - Pulmonary arteries: PA peak pressure: 39 mm Hg (S). - Inferior vena cava: The vessel was normal in size. The   respirophasic diameter changes were in the normal range (>= 50%),   consistent with normal central venous pressure.    Impressions:   - The patient was in rapid atrial fibrillation. EF 50%, mild   diffuse hypokinesis. Normal RV size and systolic function. No   significant valvular abnormalities. Mild pulmonary hypertension.    CHA2DS2-VASc Score = 2  The patient's score is based upon: CHF History: 0 HTN History: 0 Diabetes History: 0 Stroke History: 0 Vascular Disease History: 0 Age Score: 2 Gender Score: 0       ASSESSMENT AND PLAN: Permanent Atrial Fibrillation (ICD10:  I48.11) The patient's CHA2DS2-VASc score is 2, indicating a 2.2% annual risk of stroke.   Patient asymptomatic and rate controlled Did not tolerate scheduled BB due to fatigue Continue Eliquis 5 mg BID  Secondary Hypercoagulable State (ICD10:  D68.69) The patient is at significant risk for stroke/thromboembolism based upon his CHA2DS2-VASc Score of 2.  Continue Apixaban (Eliquis).     Follow up in the AF clinic as needed.      Jorja Loa PA-C [facility] [address] [place], [place] [address] [phone]

## 2023-12-06 ENCOUNTER — Other Ambulatory Visit (HOSPITAL_COMMUNITY): Payer: Self-pay | Admitting: Physician Assistant
